# Patient Record
Sex: Female | Born: 1955 | Race: White | Hispanic: No | Marital: Married | Smoking: Never smoker
Health system: Southern US, Community
[De-identification: ages and names within clinical notes are randomized; demographics above are authoritative.]

## PROBLEM LIST (undated history)

## (undated) DIAGNOSIS — N809 Endometriosis, unspecified: Secondary | ICD-10-CM

## (undated) DIAGNOSIS — E119 Type 2 diabetes mellitus without complications: Secondary | ICD-10-CM

## (undated) DIAGNOSIS — E785 Hyperlipidemia, unspecified: Secondary | ICD-10-CM

## (undated) DIAGNOSIS — L900 Lichen sclerosus et atrophicus: Secondary | ICD-10-CM

## (undated) DIAGNOSIS — E039 Hypothyroidism, unspecified: Secondary | ICD-10-CM

## (undated) HISTORY — DX: Lichen sclerosus et atrophicus: L90.0

## (undated) HISTORY — DX: Hypothyroidism, unspecified: E03.9

## (undated) HISTORY — DX: Type 2 diabetes mellitus without complications: E11.9

## (undated) HISTORY — DX: Endometriosis, unspecified: N80.9

## (undated) HISTORY — PX: LAPAROSCOPY: SHX197

## (undated) HISTORY — PX: SHOULDER SURGERY: SHX246

## (undated) HISTORY — PX: WISDOM TOOTH EXTRACTION: SHX21

## (undated) HISTORY — DX: Hyperlipidemia, unspecified: E78.5

---

## 2011-05-21 DIAGNOSIS — E039 Hypothyroidism, unspecified: Secondary | ICD-10-CM | POA: Insufficient documentation

## 2013-12-31 ENCOUNTER — Encounter: Payer: Self-pay | Admitting: Obstetrics & Gynecology

## 2014-01-14 ENCOUNTER — Encounter: Payer: Self-pay | Admitting: Obstetrics & Gynecology

## 2014-02-25 ENCOUNTER — Ambulatory Visit (INDEPENDENT_AMBULATORY_CARE_PROVIDER_SITE_OTHER): Payer: Managed Care, Other (non HMO) | Admitting: Obstetrics & Gynecology

## 2014-02-25 ENCOUNTER — Encounter: Payer: Self-pay | Admitting: Obstetrics & Gynecology

## 2014-02-25 ENCOUNTER — Other Ambulatory Visit: Payer: Self-pay | Admitting: *Deleted

## 2014-02-25 VITALS — BP 125/63 | HR 72 | Resp 16 | Ht 67.0 in | Wt 143.0 lb

## 2014-02-25 DIAGNOSIS — N951 Menopausal and female climacteric states: Secondary | ICD-10-CM

## 2014-02-25 DIAGNOSIS — Z1151 Encounter for screening for human papillomavirus (HPV): Secondary | ICD-10-CM

## 2014-02-25 DIAGNOSIS — Z01419 Encounter for gynecological examination (general) (routine) without abnormal findings: Secondary | ICD-10-CM

## 2014-02-25 DIAGNOSIS — Z124 Encounter for screening for malignant neoplasm of cervix: Secondary | ICD-10-CM

## 2014-02-25 MED ORDER — CONJ ESTROG-MEDROXYPROGEST ACE 0.625-2.5 MG PO TABS
1.0000 | ORAL_TABLET | Freq: Every day | ORAL | Status: DC
Start: 2014-02-25 — End: 2014-02-25

## 2014-02-25 MED ORDER — CONJ ESTROG-MEDROXYPROGEST ACE 0.625-2.5 MG PO TABS: 1.0000 | ORAL_TABLET | Freq: Every day | ORAL | Status: DC

## 2014-02-27 NOTE — Progress Notes (Signed)
  Subjective:    Tanya Schmidt is a 58 y.o. female who presents for an annual exam. The patient has no complaints today. The patient is sexually active. GYN screening history: last pap: was normal and last mammogram: was normal. (per patient)The patient wears seatbelts: yes. The patient participates in regular exercise: yes. Has the patient ever been transfused or tattooed?: not asked. The patient reports that there is not domestic violence in her life.   Pt is having severe hot flashes and night sweats that is inhibiting her sleep.  She used to be on prempro but stopped.  She is interested in resuming because hot flashes are unbearable.  We discussed risks of HRT and she accepts these risks (stroke, breast cnacer, DVT/PE).  She agrees to tyr for one year at lowest dose and then revisit weaning.  Pt also c/o pain on vulva/perineum--feels like stretching.  Menstrual History: OB History   Grav Para Term Preterm Abortions TAB SAB Ect Mult Living   No LMP recorded. Patient is postmenopausal.    The following portions of the patient's history were reviewed and updated as appropriate: allergies, current medications, past family history, past medical history, past social history, past surgical history and problem list.  Review of Systems Pertinent items are noted in HPI.    Objective:      Filed Vitals:   02/25/14 1544  BP: 125/63  Pulse: 72  Resp: 16  Height:  (1.702 m)  Weight: 143 lb (64.864 kg)   Vitals:  WNL General appearance: alert, cooperative and no distress Head: Normocephalic, without obvious abnormality, atraumatic Eyes: negative Throat: lips, mucosa, and tongue normal; teeth and gums normal Lungs: clear to auscultation bilaterally Breasts: normal appearance, no masses or tenderness, No nipple retraction or dimpling, No nipple discharge or bleeding Heart: regular rate and rhythm Abdomen: soft, non-tender; bowel sounds normal; no masses,  no  organomegaly  Pelvic:  External Genitalia:  Tanner V, white, thick skin on perineum (??lichen sclerosis) Urethra:  No prolapse Vagina:  Pale pink, normal rugae, no blood or discharge Cervix:  No CMT, no lesion Uterus:  Normal size and contour, non tender Adnexa:  Normal, no masses, non tender Rectovaginal Septum:  Non tender, no masses  Extremities: no edema, redness or tenderness in the calves or thighs Skin: no lesions or rash Lymph nodes: Axillary adenopathy: none    .    Assessment:    Healthy female exam.  Probable lichen sclerosis--need biopsy   Plan:     All questions answered. Mammogram. Pap smear.  Pt to return for biopsy Prempro ordered.

## 2014-03-02 LAB — CYTOLOGY - PAP

## 2014-03-15 ENCOUNTER — Encounter: Payer: Self-pay | Admitting: Obstetrics & Gynecology

## 2014-03-15 ENCOUNTER — Ambulatory Visit (INDEPENDENT_AMBULATORY_CARE_PROVIDER_SITE_OTHER): Payer: Managed Care, Other (non HMO) | Admitting: Obstetrics & Gynecology

## 2014-03-15 VITALS — BP 116/50 | HR 67 | Resp 16 | Ht 67.0 in | Wt 142.0 lb

## 2014-03-15 DIAGNOSIS — N9089 Other specified noninflammatory disorders of vulva and perineum: Secondary | ICD-10-CM

## 2014-03-15 NOTE — Addendum Note (Signed)
Addended by: Granville Lewis on: 03/15/2014 05:48 PM   Modules accepted: Orders

## 2014-03-15 NOTE — Progress Notes (Signed)
VULVAR BIOPSY NOTE  The indications for vulvar biopsy (rule out neoplasia, establish lichen sclerosus diagnosis) were reviewed.   Risks of the biopsy including pain, bleeding, infection, inadequate specimen, scarring and need for additional procedures  were discussed. The patient stated understanding and agreed to undergo procedure today. Consent was signed,  time out performed.  The patient's vulva was prepped with Betadine. 1% lidocaine was injected into perineum. A 3-mm punch biopsy was done  One ust below fourchette and one just above anus, biopsy tissue was picked up with sterile forceps and sterile scissors were used to excise the lesion.  Small bleeding was noted and hemostasis was achieved using silver nitrate sticks.  The patient tolerated the procedure well. Post-procedure instructions  (pelvic rest for one week) were given to the patient. The patient is to call with heavy bleeding, fever greater than 100.4, foul smelling vaginal discharge or other concerns. The patient will be return to clinic in two weeks for discussion of results.

## 2014-03-19 ENCOUNTER — Telehealth: Payer: Self-pay | Admitting: *Deleted

## 2014-03-19 ENCOUNTER — Encounter: Payer: Self-pay | Admitting: Obstetrics & Gynecology

## 2014-03-19 DIAGNOSIS — L900 Lichen sclerosus et atrophicus: Secondary | ICD-10-CM

## 2014-03-19 DIAGNOSIS — N904 Leukoplakia of vulva: Secondary | ICD-10-CM | POA: Insufficient documentation

## 2014-03-19 MED ORDER — CLOBETASOL PROPIONATE 0.05 % EX OINT: 1.0000 "application " | TOPICAL_OINTMENT | Freq: Two times a day (BID) | CUTANEOUS | Status: DC

## 2014-03-19 NOTE — Telephone Encounter (Signed)
Gave pt path results and sent Temovate to pharm

## 2014-03-23 ENCOUNTER — Ambulatory Visit (HOSPITAL_COMMUNITY): Admission: RE | Admit: 2014-03-23 | Payer: Managed Care, Other (non HMO) | Source: Ambulatory Visit

## 2014-04-26 ENCOUNTER — Encounter: Payer: Self-pay | Admitting: Obstetrics & Gynecology

## 2014-05-03 ENCOUNTER — Ambulatory Visit (INDEPENDENT_AMBULATORY_CARE_PROVIDER_SITE_OTHER): Payer: Managed Care, Other (non HMO) | Admitting: Obstetrics & Gynecology

## 2014-05-03 ENCOUNTER — Encounter: Payer: Self-pay | Admitting: Obstetrics & Gynecology

## 2014-05-03 VITALS — BP 114/62 | HR 70 | Resp 16 | Ht 67.0 in | Wt 138.0 lb

## 2014-05-03 DIAGNOSIS — L900 Lichen sclerosus et atrophicus: Secondary | ICD-10-CM | POA: Insufficient documentation

## 2014-05-03 NOTE — Progress Notes (Addendum)
   Subjective:    Patient ID: Tanya Schmidt, female    DOB: [birth year], 58 y.o.   MRN: [id number]  HPI  Patient is a 58 year old female with lichen sclerosis. Patient has been on temovate twice a day for a week and once a day for 3 weeks. She has improved symptoms.  Patient has less itching and less tightness.  Review of Systems  Constitutional: Negative.   Gastrointestinal: Negative.   Genitourinary: Negative for vaginal bleeding and vaginal discharge.       Objective:   Physical Exam  Genitourinary:             Assessment & Plan:  58 year old female with lichen sclerosus onTemovate continue once nightly clobetasol. At the end of 2 weeks patient is to feel for the thickened plaque. She was shown and can tell the plaque from her exam today. The patient still fills the plaque she will continue once a day for another 2 weeks and return to the office. It is not completely resolved we may consider a injection directly into the lesion.

## 2014-05-31 ENCOUNTER — Encounter: Payer: Self-pay | Admitting: Obstetrics & Gynecology

## 2014-05-31 ENCOUNTER — Ambulatory Visit (INDEPENDENT_AMBULATORY_CARE_PROVIDER_SITE_OTHER): Payer: Managed Care, Other (non HMO) | Admitting: Obstetrics & Gynecology

## 2014-05-31 VITALS — BP 116/63 | HR 70 | Resp 16 | Ht 66.75 in | Wt 138.0 lb

## 2014-05-31 DIAGNOSIS — L900 Lichen sclerosus et atrophicus: Secondary | ICD-10-CM

## 2014-05-31 NOTE — Progress Notes (Signed)
   Subjective:    Patient ID: Tanya Schmidt, female    DOB: 01/31/1956, 58 y.o.   MRN: [id number]  HPI Pt presents for evaluation of lichen sclerosis.  Pt putting on Temovate 3-4 times a week.  Symptoms are much improved.  BMs no longer hurt.     Review of Systems    as above Objective:   Physical Exam  Genitourinary:             Assessment & Plan:  Lichen sclerosis Temovate 3x a week RTC 3 months.

## 2014-10-22 ENCOUNTER — Telehealth: Payer: Self-pay | Admitting: *Deleted

## 2014-10-22 DIAGNOSIS — B3731 Acute candidiasis of vulva and vagina: Secondary | ICD-10-CM

## 2014-10-22 DIAGNOSIS — B373 Candidiasis of vulva and vagina: Secondary | ICD-10-CM

## 2014-10-22 MED ORDER — FLUCONAZOLE 150 MG PO TABS: 150.0000 mg | ORAL_TABLET | Freq: Once | ORAL | Status: DC

## 2014-10-22 NOTE — Telephone Encounter (Signed)
Pt called in stating she has had elevated blood sugar readings and vaginal area is irritated. Pt states she is sure it is a yeast infection. Pt asked if we could send in Diflucan today and if no better she will call on Monday to schedule appt for eval. Sent meds to [facility] per pt req.

## 2015-03-03 ENCOUNTER — Ambulatory Visit: Payer: Managed Care, Other (non HMO) | Admitting: Obstetrics & Gynecology

## 2015-03-07 ENCOUNTER — Encounter: Payer: Self-pay | Admitting: Obstetrics & Gynecology

## 2015-03-07 ENCOUNTER — Ambulatory Visit (INDEPENDENT_AMBULATORY_CARE_PROVIDER_SITE_OTHER): Payer: Managed Care, Other (non HMO) | Admitting: Obstetrics & Gynecology

## 2015-03-07 VITALS — BP 123/69 | HR 78 | Resp 16 | Ht 67.0 in | Wt 145.0 lb

## 2015-03-07 DIAGNOSIS — L900 Lichen sclerosus et atrophicus: Secondary | ICD-10-CM | POA: Diagnosis not present

## 2015-03-12 NOTE — Progress Notes (Signed)
  Subjective:    Tanya Schmidt is a 59 y.o. female who presents for an annual exam. The patient has complaints of pain at introitus and rectum from lichen sclerosis. The patient is sexually active. GYN screening history: last pap: was normal and last mammogram: was normal. The patient wears seatbelts: yes. The patient participates in regular exercise: yes.   Menstrual History: OB History    Gravida Para Term Preterm AB TAB SAB Ectopic Multiple Living   No LMP recorded. Patient is postmenopausal.    The following portions of the patient's history were reviewed and updated as appropriate: allergies, current medications, past family history, past medical history, past social history, past surgical history and problem list.  Review of Systems Pertinent items are noted in HPI.   Patient denies CP, SOB, HA, PMB, depression, abdominal pain   Objective:      Filed Vitals:   03/07/15 1523  BP: 123/69  Pulse: 78  Resp: 16  Height:  (1.702 m)  Weight: 145 lb (65.772 kg)   Vitals:  WNL General appearance: alert, cooperative and no distress  HEENT: Normocephalic, without obvious abnormality, atraumatic Eyes: negative Throat: lips, mucosa, and tongue normal; teeth and gums normal  Respiratory: Clear to auscultation bilaterally  CV: Regular rate and rhythm  Breasts:  Normal appearance, no masses or tenderness, no nipple retraction or dimpling  GI: Soft, non-tender; bowel sounds normal; no masses,  no organomegaly  GU: External Genitalia:  Tanner V, lichen sclerosis is present and slightly worse from last exam.  Fissure in fourchette of introitus. Urethra:  No prolapse   Vagina: Pink, normal rugae, no blood or discharge  Cervix: No CMT, no lesion  Uterus:  Normal size and contour, non tender  Adnexa: Normal, no masses, non tender  Musculoskeletal: No edema, redness or tenderness in the calves or thighs  Skin: No lesions or rash  Lymphatic: Axillary  adenopathy: none    Psychiatric: Normal mood and behavior    Assessment:    Healthy female exam.   Lichen Scleoris   Plan:    Pap up to date Mammogram due 11/16 Resume clobetasol daily for a week then twice a week until seen again. Re-biopsy is plaque present and consider intra lesion injection of clobetasol.

## 2015-04-21 ENCOUNTER — Other Ambulatory Visit: Payer: Self-pay | Admitting: Obstetrics & Gynecology

## 2015-05-02 ENCOUNTER — Telehealth: Payer: Self-pay | Admitting: *Deleted

## 2015-05-02 ENCOUNTER — Encounter: Payer: Self-pay | Admitting: *Deleted

## 2015-05-02 NOTE — Telephone Encounter (Signed)
-----   Message from Lesly DukesKelly H Leggett, MD sent at 04/26/2015  6:13 AM EDT ----- I would like to see her in January to see if the placque has lessened. ----- Message -----    From: Granville LewisLora L Maymunah Stegemann, RN    Sent: 04/25/2015   8:00 AM      To: Lesly DukesKelly H Leggett, MD  She doesn't have one scheduled.  Last u saw her was for annual 03/08/15 ----- Message -----    From: Lesly DukesKelly H Leggett, MD    Sent: 04/24/2015   2:49 PM      To: Ever[facility]  When is Alexia Freestonenna Marie coming again?  She needs f/u in a few months after last visit.  I just refilled her temobate

## 2015-05-02 NOTE — Telephone Encounter (Signed)
Letter mailed to pt regarding making a R/U appt in January with Dr Penne LashLeggett to recheck Lichen Sclerosus

## 2015-05-12 ENCOUNTER — Telehealth: Payer: Self-pay | Admitting: *Deleted

## 2015-05-12 DIAGNOSIS — N951 Menopausal and female climacteric states: Secondary | ICD-10-CM

## 2015-05-12 MED ORDER — CONJ ESTROG-MEDROXYPROGEST ACE 0.625-2.5 MG PO TABS: 1.0000 | ORAL_TABLET | Freq: Every day | ORAL | Status: DC

## 2015-05-12 NOTE — Telephone Encounter (Signed)
rcvd fax from pharmacy to refill Prempro

## 2015-05-13 MED ORDER — CONJ ESTROG-MEDROXYPROGEST ACE 0.625-2.5 MG PO TABS: 1.0000 | ORAL_TABLET | Freq: Every day | ORAL | Status: DC

## 2015-05-13 NOTE — Addendum Note (Signed)
Addended by: Arne ClevelandHUTCHINSON, MANDY J on: 05/13/2015 10:41 AM   Modules accepted: Orders

## 2015-05-13 NOTE — Telephone Encounter (Signed)
Changed pharmacy and resent Prempro

## 2016-03-15 ENCOUNTER — Ambulatory Visit: Payer: Managed Care, Other (non HMO) | Admitting: Obstetrics & Gynecology

## 2016-04-02 ENCOUNTER — Encounter: Payer: Self-pay | Admitting: Obstetrics & Gynecology

## 2016-04-02 ENCOUNTER — Ambulatory Visit (INDEPENDENT_AMBULATORY_CARE_PROVIDER_SITE_OTHER): Payer: Managed Care, Other (non HMO) | Admitting: Obstetrics & Gynecology

## 2016-04-02 VITALS — BP 126/72 | HR 67 | Ht 67.0 in | Wt 148.0 lb

## 2016-04-02 DIAGNOSIS — L900 Lichen sclerosus et atrophicus: Secondary | ICD-10-CM

## 2016-04-02 DIAGNOSIS — Z0000 Encounter for general adult medical examination without abnormal findings: Secondary | ICD-10-CM

## 2016-04-02 DIAGNOSIS — Z01419 Encounter for gynecological examination (general) (routine) without abnormal findings: Secondary | ICD-10-CM | POA: Diagnosis not present

## 2016-04-02 MED ORDER — CLOBETASOL PROPIONATE 0.05 % EX OINT: TOPICAL_OINTMENT | CUTANEOUS | 1 refills | Status: DC

## 2016-04-02 MED ORDER — CONJ ESTROG-MEDROXYPROGEST ACE 0.3-1.5 MG PO TABS: 1.0000 | ORAL_TABLET | Freq: Every day | ORAL | 6 refills | Status: DC

## 2016-04-04 NOTE — Progress Notes (Signed)
Subjective:    Tanya Schmidt is a 60 y.o. female who presents for an annual exam. The patient has no complaints today.  Vulva does not itch, but she is not using steroid cream as prescribed. The patient is sexually active with some dyspareunia. GYN screening history: last pap: was normal in 2015. The patient wears seatbelts: yes. The patient participates in regular exercise: no. Has the patient ever been transfused or tattooed?: not asked. The patient reports that there is not domestic violence in her life.   Menstrual History: OB History    Gravida Para Term Preterm AB Living   3 2 2     2    SAB TAB Ectopic Multiple Live Births                  No LMP recorded. Patient is postmenopausal.    The following portions of the patient's history were reviewed and updated as appropriate: allergies, current medications, past family history, past medical history, past social history, past surgical history and problem list.  Review of Systems Pertinent items noted in HPI and remainder of comprehensive ROS otherwise negative.    Objective:      Vitals:   04/02/16 1514  BP: 126/72  Pulse: 67  Weight: 148 lb (67.1 kg)  Height: 5\' 7"  (1.702 m)   Vitals:  WNL General appearance: alert, cooperative and no distress  HEENT: Normocephalic, without obvious abnormality, atraumatic Eyes: negative Throat: lips, mucosa, and tongue normal; teeth and gums normal  Respiratory: Clear to auscultation bilaterally  CV: Regular rate and rhythm  Breasts:  Normal appearance, no masses or tenderness, no nipple retraction or dimpling  GI: Soft, non-tender; bowel sounds normal; no masses,  no organomegaly  GU: External Genitalia:  Tanner V, no lesion Urethra:  No prolapse  Introitus--there is new change in architecture with tight band at 6 o'clock.  Plaque on perineum improved.  Vagina: Pink, normal rugae, no blood or discharge  Cervix: No CMT, no lesion  Uterus:  Normal size and contour, non tender   Adnexa: Normal, no masses, non tender  Musculoskeletal: No edema, redness or tenderness in the calves or thighs  Skin: No lesions or rash  Lymphatic: Axillary adenopathy: none    Psychiatric: Normal mood and behavior    .    Assessment:    Healthy female exam.   Lichen sclerosis   Plan:     Mammogram yearly Colonoscopy q 10 years Resume clobetasol--bid for a week, daily for a week, 3x a week for a week, then twice a week. Pt wants to continue HRT (aware of increased risk of breast cance with prolonged use). RTC 6 weeks.

## 2016-04-10 ENCOUNTER — Telehealth: Payer: Self-pay | Admitting: *Deleted

## 2016-04-10 DIAGNOSIS — B3731 Acute candidiasis of vulva and vagina: Secondary | ICD-10-CM

## 2016-04-10 DIAGNOSIS — B373 Candidiasis of vulva and vagina: Secondary | ICD-10-CM

## 2016-04-10 MED ORDER — CONJ ESTROG-MEDROXYPROGEST ACE 0.3-1.5 MG PO TABS: 1.0000 | ORAL_TABLET | Freq: Every day | ORAL | 6 refills | Status: DC

## 2016-04-10 MED ORDER — FLUCONAZOLE 150 MG PO TABS: 150.0000 mg | ORAL_TABLET | Freq: Every day | ORAL | 0 refills | Status: DC

## 2016-04-10 MED ORDER — CLOBETASOL PROPIONATE 0.05 % EX OINT: TOPICAL_OINTMENT | CUTANEOUS | 1 refills | Status: DC

## 2016-04-10 NOTE — Telephone Encounter (Signed)
Pt called stating that she is diabetic and does have a yeast infection and requested a RX for Diflucan.  This [facility].  She also requested that her Clob[facility]asol and [facility] since it was long term meds.  [facility] sent to [facility]

## 2016-05-22 ENCOUNTER — Encounter: Payer: Self-pay | Admitting: Obstetrics & Gynecology

## 2016-05-22 ENCOUNTER — Ambulatory Visit (INDEPENDENT_AMBULATORY_CARE_PROVIDER_SITE_OTHER): Payer: Managed Care, Other (non HMO) | Admitting: Obstetrics & Gynecology

## 2016-05-22 VITALS — BP 114/63 | HR 98 | Resp 16 | Ht 67.0 in | Wt 150.0 lb

## 2016-05-22 DIAGNOSIS — N904 Leukoplakia of vulva: Secondary | ICD-10-CM | POA: Diagnosis not present

## 2016-05-24 NOTE — Progress Notes (Signed)
   Subjective:    Patient ID: Tanya Schmidt, female    DOB: 1955-08-05, 60 y.o.   MRN: [id number]  HPI Pt presents with pressure and itching at anus.  She is unsure whether she has a hemorrhoid.  Pt has been using clobetasol 2-3 times a week since last visit  Where the lichen had noted to have worsened.  (picture)   Review of Systems  Constitutional: Negative.   Respiratory: Negative.   Cardiovascular: Negative.   Gastrointestinal: Positive for constipation.  Genitourinary: Positive for dyspareunia and vaginal pain. Negative for menstrual problem.  Psychiatric/Behavioral: Negative.    Vitals:   05/22/16 1458  BP: 114/63  Pulse: 98  Resp: 16  Weight: 150 lb (68 kg)  Height: 5\' 7"  (1.702 m)      Objective:   Physical Exam  Constitutional: She is oriented to person, place, and time. She appears well-developed and well-nourished. No distress.  Cardiovascular: Normal rate.   Pulmonary/Chest: Effort normal.  Abdominal: Soft. There is no tenderness.  Genitourinary:  Genitourinary Comments: Thick band exists at 6 o'clock with increase in LS on perineum towards anus.  ? Small hemorrhoid at 12 0'clock of anus.    Musculoskeletal: She exhibits no edema.  Neurological: She is alert and oriented to person, place, and time.  Skin: Skin is warm and dry.  Psychiatric: She has a normal mood and affect.   Vitals:   05/22/16 1458  BP: 114/63  Pulse: 98  Resp: 16  Weight: 150 lb (68 kg)  Height: 5\' 7"  (1.702 m)    Assessment & Plan:  60 yo female with slightly worsened LS  1-From up to date: Thickened hypertrophic plaques may respond poorly to topical corticosteroids. In these cases, we suggest injection of triamcinolone hexacetonide or triamcinolone acetonide directly into the area of involvement once per month for three months [87]. Pretreatment with a small amount of a topical anesthetic (eg, eutectic mixture of lidocaine and prilocaine) and use of a small gauge needle help to  minimize patient discomfort. For small lesions (no more than 2 x 2 cm), we add 2 mL saline to 1 mL of triamcinolone (10 mg/mL) to make a solution of 3.3 mg/mL. We then inject 0.5 to 1 mL intralesionally with a 25 to 30 gauge needle to treat a lesion of 1 to 2 cm. For lesions covering a larger area, we perform several injections using the same concentration. We do not inject more than 3 mL per treatment session.   Will order triamcinolone and inject monthly for 3 months  [place] and [place] to [place].

## 2016-06-06 ENCOUNTER — Ambulatory Visit (INDEPENDENT_AMBULATORY_CARE_PROVIDER_SITE_OTHER): Payer: Managed Care, Other (non HMO) | Admitting: Obstetrics & Gynecology

## 2016-06-06 ENCOUNTER — Encounter: Payer: Self-pay | Admitting: Obstetrics & Gynecology

## 2016-06-06 VITALS — BP 100/63 | HR 76 | Ht 67.0 in | Wt 151.0 lb

## 2016-06-06 DIAGNOSIS — L900 Lichen sclerosus et atrophicus: Secondary | ICD-10-CM | POA: Diagnosis not present

## 2016-06-06 NOTE — Progress Notes (Signed)
   Subjective:    Patient ID: Tanya Schmidt, female    DOB: December 29, 1955, 60 y.o.   MRN: [id number]  HPI  Patient presents for continuation of lichen sclerosus complaints. Patient has been compliant with topical clobetasol. Her still itching and pain on the perineum.  Review of Systems  Genitourinary: Positive for vaginal pain. Negative for vaginal bleeding.       Objective:   Physical Exam  Constitutional: She appears well-developed and well-nourished. No distress.  Abdominal: Soft.  Genitourinary:  Genitourinary Comments: Perineum slightly improved from 3 weeks ago with the clobetasol use. Plaque still present on perineum  After informed consent was obtained patient's patient dorsolithotomy position the area was cleaned with Betadine. A mixture of triamcinolone and saline was injected into the perineum. Patient tolerated procedure well.  (See prior note about indications and directions for mixing).    Assessment & Plan:  60 year old female with lichen sclerosus plaque not responsive to topical therapy  1-triamcenalone injection as above 2-A she has type 1 diabetes on an S1 pump. Patient should monitor her glucose closely and increase basal rate as indicated. 3-RTC 1 month for injection

## 2016-06-06 NOTE — Progress Notes (Signed)
Pt presents for 

## 2016-06-08 ENCOUNTER — Telehealth: Payer: Self-pay | Admitting: *Deleted

## 2016-06-08 DIAGNOSIS — B3731 Acute candidiasis of vulva and vagina: Secondary | ICD-10-CM

## 2016-06-08 DIAGNOSIS — B373 Candidiasis of vulva and vagina: Secondary | ICD-10-CM

## 2016-06-08 MED ORDER — FLUCONAZOLE 150 MG PO TABS: ORAL_TABLET | ORAL | 1 refills | Status: DC

## 2016-06-08 NOTE — Telephone Encounter (Signed)
Pt called stating that she is having itching, burning from vagina to rectum.  She is requesting Diflucan for itching since she is pretty sure this is yeast.  She is diabetic and most likely is yeast.  [facility].  Pt aware that if not better by the first of the week she will need to be seen.

## 2016-07-12 ENCOUNTER — Ambulatory Visit: Payer: Managed Care, Other (non HMO) | Admitting: Obstetrics & Gynecology

## 2016-07-26 ENCOUNTER — Encounter: Payer: Self-pay | Admitting: Obstetrics & Gynecology

## 2016-07-26 ENCOUNTER — Ambulatory Visit (INDEPENDENT_AMBULATORY_CARE_PROVIDER_SITE_OTHER): Payer: Managed Care, Other (non HMO) | Admitting: Obstetrics & Gynecology

## 2016-07-26 VITALS — BP 122/65 | HR 76 | Ht 67.0 in | Wt 154.0 lb

## 2016-07-26 DIAGNOSIS — N9089 Other specified noninflammatory disorders of vulva and perineum: Secondary | ICD-10-CM

## 2016-07-26 DIAGNOSIS — L900 Lichen sclerosus et atrophicus: Secondary | ICD-10-CM

## 2016-07-26 MED ORDER — CLOTRIMAZOLE-BETAMETHASONE 1-0.05 % EX CREA: TOPICAL_CREAM | CUTANEOUS | 0 refills | Status: DC

## 2016-07-26 NOTE — Progress Notes (Signed)
   Subjective:    Patient ID: Tanya Schmidt, female    DOB: 10-15-1955, 61 y.o.   MRN: [id number]  HPI  61 yo female presents for 2nd injection of steroid to perineum.  Pt's plaque is decreased.  Pt complaining of redness that feels "yeasty" on her labia majora.  Pt's insulin requirements increased 30 percent for one week after last steroid injection  Review of Systems  Constitutional: Negative.   Gastrointestinal: Negative.   Genitourinary: Positive for vaginal pain. Negative for vaginal bleeding and vaginal discharge.       Objective:   Physical Exam  Constitutional: She appears well-developed and well-nourished. No distress.  HENT:  Head: Normocephalic and atraumatic.  Abdominal: Soft. She exhibits no distension.  Genitourinary:  Genitourinary Comments: Redness on labia majora looks like contact allergy or yeast See picture under media of perinuem. Plaque extending from introitus is smaller and less firm.  Hemorrhoid present.    Vitals:   07/26/16 1600  BP: 122/65  Pulse: 76  Weight: 154 lb (69.9 kg)  Height: 5\' 7"  (1.702 m)    Assessment & Plan:  61 yo female with lichen sclerosis and contact dermatitis vs yeast of labia majora  1.  LS--2nd steroid injection completed; continue clobetasol twice a week 2.  lotrisome to labia majora; swith to Cetaphil, recommend stopping the flushable wipes that are used at time of urinating and bowel movements. 3.  RTC 1 month for third injection.

## 2016-08-27 ENCOUNTER — Ambulatory Visit (INDEPENDENT_AMBULATORY_CARE_PROVIDER_SITE_OTHER): Payer: Managed Care, Other (non HMO) | Admitting: Obstetrics & Gynecology

## 2016-08-27 ENCOUNTER — Encounter: Payer: Self-pay | Admitting: Obstetrics & Gynecology

## 2016-08-27 VITALS — BP 143/65 | HR 65 | Ht 67.0 in | Wt 149.0 lb

## 2016-08-27 DIAGNOSIS — L900 Lichen sclerosus et atrophicus: Secondary | ICD-10-CM

## 2016-08-27 DIAGNOSIS — R197 Diarrhea, unspecified: Secondary | ICD-10-CM

## 2016-08-27 DIAGNOSIS — K648 Other hemorrhoids: Secondary | ICD-10-CM

## 2016-08-27 NOTE — Progress Notes (Signed)
   Subjective:    Patient ID: Tanya Schmidt, female    DOB: 1956/02/02, 61 y.o.   MRN: [id number]  HPI  Pt presents for evaluation of Her lichen sclerosus. Patient has been using clobetasol cream twice a week and doing well. She has no pain with intercourse. Patient has been having frequent bowel movements and burning near her anus. This is not new for her but did get worse. Patient knows that she is not getting clean and she wipes. She does have a history of hemorrhoids. She has a negative colonoscopy in the past but can't quite irregular and when that was. Patient states her bowel movements are still slightly flattened.  She thought this was due to her hemorrhoids. Patient denies vaginal discharge vaginal bleeding or dyspareunia.  Review of Systems  Constitutional: Negative.   Cardiovascular: Negative.   Gastrointestinal: Positive for rectal pain. Negative for anal bleeding, constipation, diarrhea and vomiting.  Genitourinary: Negative for pelvic pain, vaginal bleeding, vaginal discharge and vaginal pain.       Objective:   Physical Exam  Constitutional: She is oriented to person, place, and time. She appears well-developed and well-nourished. No distress.  HENT:  Head: Normocephalic and atraumatic.  Eyes: Conjunctivae are normal.  Pulmonary/Chest: Effort normal.  Abdominal: Soft. Bowel sounds are normal. She exhibits no distension and no mass. There is no tenderness. There is no rebound and no guarding.  Genitourinary:     Musculoskeletal: She exhibits no edema.  Neurological: She is alert and oriented to person, place, and time.  Skin: Skin is warm and dry.  Psychiatric: She has a normal mood and affect.  Vitals reviewed.  Vitals:   08/27/16 1418  BP: (!) 143/65  Pulse: 65  Weight: 149 lb (67.6 kg)  Height: 5\' 7"  (1.702 m)    Assessment & Plan:  61 year old female with lichen sclerosus and anal irritation due to frequent soft bowel movements and incomplete  wiping  1.  No need for steroid injection today as the plaque on her perineum is now resolved. Patient should continue clobetasol twice a week that area. There is no evidence of atrophic vaginitis. 2-referral to GI for rectal and GI issues as described above.

## 2016-09-20 LAB — HM COLONOSCOPY

## 2016-12-10 ENCOUNTER — Other Ambulatory Visit: Payer: Self-pay | Admitting: Obstetrics & Gynecology

## 2016-12-10 DIAGNOSIS — B373 Candidiasis of vulva and vagina: Secondary | ICD-10-CM

## 2016-12-10 DIAGNOSIS — B3731 Acute candidiasis of vulva and vagina: Secondary | ICD-10-CM

## 2017-02-26 ENCOUNTER — Telehealth: Payer: Self-pay | Admitting: *Deleted

## 2017-02-26 DIAGNOSIS — B373 Candidiasis of vulva and vagina: Secondary | ICD-10-CM

## 2017-02-26 DIAGNOSIS — B3731 Acute candidiasis of vulva and vagina: Secondary | ICD-10-CM

## 2017-02-26 MED ORDER — FLUCONAZOLE 150 MG PO TABS: ORAL_TABLET | ORAL | 1 refills | Status: DC

## 2017-02-26 NOTE — Telephone Encounter (Signed)
-----   Message from Pennie BanterMarni W Smith sent at 02/26/2017  2:35 PM EDT ----- Regarding: Rx request Patient has a yeast infection and is requesting Diflucan be sent to her pharmacy [facility]( [place] [facility])

## 2017-02-26 NOTE — Telephone Encounter (Signed)
Pt called requesting a RF on her Diflucan 150 mg.  She is diabetic and per Dr Marice Potterove, she may have a RF.  [facility]

## 2017-05-21 ENCOUNTER — Other Ambulatory Visit (HOSPITAL_COMMUNITY): Payer: Self-pay | Admitting: Obstetrics & Gynecology

## 2017-05-21 DIAGNOSIS — B373 Candidiasis of vulva and vagina: Secondary | ICD-10-CM

## 2017-05-21 DIAGNOSIS — B3731 Acute candidiasis of vulva and vagina: Secondary | ICD-10-CM

## 2017-09-10 ENCOUNTER — Telehealth: Payer: Self-pay | Admitting: Obstetrics & Gynecology

## 2017-09-10 ENCOUNTER — Other Ambulatory Visit: Payer: Self-pay | Admitting: *Deleted

## 2017-09-10 DIAGNOSIS — Z1231 Encounter for screening mammogram for malignant neoplasm of breast: Secondary | ICD-10-CM

## 2017-09-10 MED ORDER — CONJ ESTROG-MEDROXYPROGEST ACE 0.3-1.5 MG PO TABS: 1.0000 | ORAL_TABLET | Freq: Every day | ORAL | 0 refills | Status: DC

## 2017-09-10 NOTE — Telephone Encounter (Signed)
Pt called back states had [facility] within the last 3-4 months. Advised pt to obtain report and bring to appt, it is not available in care everywhere. Pt verbalized understanding.

## 2017-09-10 NOTE — Telephone Encounter (Signed)
Pt called scheduled annual GYN visit 10/09/17. Pt needs PREMPRO refill. Pt states if filling short supply until appt please [facility] script [facility]. If filling for one year please [facility] to [facility]. Call pt to confirm [phone].

## 2017-09-10 NOTE — Telephone Encounter (Signed)
Pt called to request a RF on Prempro.  She has appt for annual with Dr Penne LashLeggett on 10/09/17.  She is overdue for her mammogram and that order was also placed.  1 RF given for Prempro until her appt.

## 2017-10-09 ENCOUNTER — Ambulatory Visit (INDEPENDENT_AMBULATORY_CARE_PROVIDER_SITE_OTHER): Payer: Managed Care, Other (non HMO) | Admitting: Obstetrics & Gynecology

## 2017-10-09 ENCOUNTER — Encounter: Payer: Self-pay | Admitting: Obstetrics & Gynecology

## 2017-10-09 VITALS — BP 147/63 | HR 86 | Resp 16 | Ht 67.0 in | Wt 145.0 lb

## 2017-10-09 DIAGNOSIS — Z01419 Encounter for gynecological examination (general) (routine) without abnormal findings: Secondary | ICD-10-CM | POA: Diagnosis not present

## 2017-10-09 DIAGNOSIS — Z124 Encounter for screening for malignant neoplasm of cervix: Secondary | ICD-10-CM | POA: Diagnosis not present

## 2017-10-09 DIAGNOSIS — Z1151 Encounter for screening for human papillomavirus (HPV): Secondary | ICD-10-CM | POA: Diagnosis not present

## 2017-10-09 NOTE — Progress Notes (Signed)
Subjective:     Tanya Schmidt is a 62 y.o. female here for a routine exam.  Current complaints: fissures of perineum.  ?due to diarrhea.  LS stable most of the time.  Uses clobetasol twice a week.  Had colonoscpy and nml.  Cleared by GI.   Gynecologic History No LMP recorded. Patient is postmenopausal. Contraception: post menopausal status Last Pap: 2015. Results were: normal Last mammogram: 2019. Results were: normal  Obstetric History OB History  Gravida Para Term Preterm AB Living  3 2 2     2   SAB TAB Ectopic Multiple Live Births               # Outcome Date GA Lbr Len/2nd Weight Sex Delivery Anes PTL Lv  3 Gravida           2 Term    11 lb 3 oz (5.075 kg)  CS-Unspec     1 Term    8 lb 3 oz (3.714 kg)  CS-Unspec        The following portions of the patient's history were reviewed and updated as appropriate: allergies, current medications, past family history, past medical history, past social history, past surgical history and problem list.  Review of Systems Pertinent items noted in HPI and remainder of comprehensive ROS otherwise negative.    Objective:      Vitals:   10/09/17 0936  BP: (!) 147/63  Pulse: 86  Resp: 16  Weight: 145 lb (65.8 kg)  Height: 5\' 7"  (1.702 m)   Vitals:  WNL General appearance: alert, cooperative and no distress  HEENT: Normocephalic, without obvious abnormality, atraumatic Eyes: negative Throat: lips, mucosa, and tongue normal; teeth and gums normal  Respiratory: Clear to auscultation bilaterally  CV: Regular rate and rhythm  Breasts:  Normal appearance, no masses or tenderness, no nipple retraction or dimpling  GI: Soft, non-tender; bowel sounds normal; no masses,  no organomegaly  GU: External Genitalia:  Tanner V, no lesion Urethra:  No prolapse   Vagina: Pink, normal rugae, no blood or discharge; LS--no plaque, small fissures.)  Cervix: No CMT, no lesion  Uterus:  Normal size and contour, non tender  Adnexa: Normal, no  masses, non tender  Musculoskeletal: No edema, redness or tenderness in the calves or thighs  Skin: No lesions or rash  Lymphatic: Axillary adenopathy: none     Psychiatric: Normal mood and behavior    Assessment:    Healthy female exam.   Stable LS   Plan:      Pap with cotesting Cont steroids Yearly mammograms PCP for other health main Stop Prempro and see how she does symptomatically.

## 2017-10-11 LAB — CYTOLOGY - PAP
DIAGNOSIS: NEGATIVE
HPV: NOT DETECTED

## 2018-02-10 ENCOUNTER — Telehealth: Payer: Self-pay | Admitting: *Deleted

## 2018-02-10 MED ORDER — CLOBETASOL PROPIONATE 0.05 % EX OINT: TOPICAL_OINTMENT | CUTANEOUS | 1 refills | Status: DC

## 2018-02-10 NOTE — Telephone Encounter (Signed)
Temovate cream changed to Biweekly as a maintenance dose.

## 2018-02-10 NOTE — Telephone Encounter (Signed)
RF sent to [facility] [employer].

## 2018-07-04 ENCOUNTER — Telehealth: Payer: Self-pay

## 2018-07-04 DIAGNOSIS — B379 Candidiasis, unspecified: Secondary | ICD-10-CM

## 2018-07-04 MED ORDER — FLUCONAZOLE 150 MG PO TABS: ORAL_TABLET | ORAL | 0 refills | Status: DC

## 2018-07-04 NOTE — Telephone Encounter (Signed)
Pt called stating that she is diabetic and feels as if she has a yeast infection. She is experiencing vaginal discharge and itching. Donette Larry, CNM said to send in 2 Diflucan and have her take 1 today and the other in 3 days. PT expressed understanding. Pharmacy verified as [facility] [address] and Rx sent.

## 2019-03-24 DIAGNOSIS — Z23 Encounter for immunization: Secondary | ICD-10-CM | POA: Diagnosis not present

## 2019-04-29 DIAGNOSIS — L900 Lichen sclerosus et atrophicus: Secondary | ICD-10-CM | POA: Diagnosis not present

## 2019-04-29 DIAGNOSIS — Z0000 Encounter for general adult medical examination without abnormal findings: Secondary | ICD-10-CM | POA: Diagnosis not present

## 2019-04-29 DIAGNOSIS — E10618 Type 1 diabetes mellitus with other diabetic arthropathy: Secondary | ICD-10-CM | POA: Diagnosis not present

## 2019-04-29 DIAGNOSIS — Z23 Encounter for immunization: Secondary | ICD-10-CM | POA: Diagnosis not present

## 2019-05-05 DIAGNOSIS — Z0000 Encounter for general adult medical examination without abnormal findings: Secondary | ICD-10-CM | POA: Diagnosis not present

## 2019-05-06 DIAGNOSIS — E109 Type 1 diabetes mellitus without complications: Secondary | ICD-10-CM | POA: Diagnosis not present

## 2019-05-06 DIAGNOSIS — Z794 Long term (current) use of insulin: Secondary | ICD-10-CM | POA: Diagnosis not present

## 2019-08-05 DIAGNOSIS — E109 Type 1 diabetes mellitus without complications: Secondary | ICD-10-CM | POA: Diagnosis not present

## 2019-08-05 DIAGNOSIS — Z794 Long term (current) use of insulin: Secondary | ICD-10-CM | POA: Diagnosis not present

## 2019-08-18 DIAGNOSIS — Z9641 Presence of insulin pump (external) (internal): Secondary | ICD-10-CM | POA: Diagnosis not present

## 2019-08-18 DIAGNOSIS — E10649 Type 1 diabetes mellitus with hypoglycemia without coma: Secondary | ICD-10-CM | POA: Diagnosis not present

## 2019-08-18 DIAGNOSIS — L608 Other nail disorders: Secondary | ICD-10-CM | POA: Diagnosis not present

## 2019-08-18 DIAGNOSIS — E109 Type 1 diabetes mellitus without complications: Secondary | ICD-10-CM | POA: Diagnosis not present

## 2019-08-18 DIAGNOSIS — Z794 Long term (current) use of insulin: Secondary | ICD-10-CM | POA: Diagnosis not present

## 2019-08-18 DIAGNOSIS — E039 Hypothyroidism, unspecified: Secondary | ICD-10-CM | POA: Diagnosis not present

## 2019-08-18 DIAGNOSIS — Z79890 Hormone replacement therapy (postmenopausal): Secondary | ICD-10-CM | POA: Diagnosis not present

## 2019-08-28 DIAGNOSIS — R350 Frequency of micturition: Secondary | ICD-10-CM | POA: Diagnosis not present

## 2019-08-28 DIAGNOSIS — Z23 Encounter for immunization: Secondary | ICD-10-CM | POA: Diagnosis not present

## 2019-08-28 DIAGNOSIS — S61432A Puncture wound without foreign body of left hand, initial encounter: Secondary | ICD-10-CM | POA: Diagnosis not present

## 2019-08-28 DIAGNOSIS — W260XXA Contact with knife, initial encounter: Secondary | ICD-10-CM | POA: Diagnosis not present

## 2019-08-28 DIAGNOSIS — R82998 Other abnormal findings in urine: Secondary | ICD-10-CM | POA: Diagnosis not present

## 2019-10-30 DIAGNOSIS — E109 Type 1 diabetes mellitus without complications: Secondary | ICD-10-CM | POA: Diagnosis not present

## 2019-10-30 DIAGNOSIS — Z794 Long term (current) use of insulin: Secondary | ICD-10-CM | POA: Diagnosis not present

## 2020-02-02 DIAGNOSIS — E109 Type 1 diabetes mellitus without complications: Secondary | ICD-10-CM | POA: Diagnosis not present

## 2020-02-02 DIAGNOSIS — Z794 Long term (current) use of insulin: Secondary | ICD-10-CM | POA: Diagnosis not present

## 2020-02-16 DIAGNOSIS — Z882 Allergy status to sulfonamides status: Secondary | ICD-10-CM | POA: Diagnosis not present

## 2020-02-16 DIAGNOSIS — E039 Hypothyroidism, unspecified: Secondary | ICD-10-CM | POA: Diagnosis not present

## 2020-02-16 DIAGNOSIS — E109 Type 1 diabetes mellitus without complications: Secondary | ICD-10-CM | POA: Diagnosis not present

## 2020-02-16 DIAGNOSIS — E10649 Type 1 diabetes mellitus with hypoglycemia without coma: Secondary | ICD-10-CM | POA: Diagnosis not present

## 2020-02-16 DIAGNOSIS — L603 Nail dystrophy: Secondary | ICD-10-CM | POA: Diagnosis not present

## 2020-03-01 DIAGNOSIS — Z794 Long term (current) use of insulin: Secondary | ICD-10-CM | POA: Diagnosis not present

## 2020-03-01 DIAGNOSIS — E109 Type 1 diabetes mellitus without complications: Secondary | ICD-10-CM | POA: Diagnosis not present

## 2020-03-10 ENCOUNTER — Ambulatory Visit: Payer: Managed Care, Other (non HMO) | Admitting: Medical-Surgical

## 2020-03-21 ENCOUNTER — Ambulatory Visit: Payer: Managed Care, Other (non HMO) | Admitting: Obstetrics & Gynecology

## 2020-03-28 ENCOUNTER — Other Ambulatory Visit: Payer: Self-pay

## 2020-03-28 ENCOUNTER — Other Ambulatory Visit (HOSPITAL_COMMUNITY)
Admission: RE | Admit: 2020-03-28 | Discharge: 2020-03-28 | Disposition: A | Discharge status: Home / Self Care | Payer: BC Managed Care – PPO | Source: Ambulatory Visit | Attending: Obstetrics & Gynecology | Admitting: Obstetrics & Gynecology

## 2020-03-28 ENCOUNTER — Encounter: Payer: Self-pay | Admitting: Obstetrics & Gynecology

## 2020-03-28 ENCOUNTER — Ambulatory Visit (INDEPENDENT_AMBULATORY_CARE_PROVIDER_SITE_OTHER): Payer: BC Managed Care – PPO | Admitting: Obstetrics & Gynecology

## 2020-03-28 VITALS — BP 114/60 | HR 82 | Ht 67.0 in | Wt 152.0 lb

## 2020-03-28 DIAGNOSIS — L900 Lichen sclerosus et atrophicus: Secondary | ICD-10-CM | POA: Diagnosis not present

## 2020-03-28 DIAGNOSIS — Z01419 Encounter for gynecological examination (general) (routine) without abnormal findings: Secondary | ICD-10-CM

## 2020-03-28 MED ORDER — CLOBETASOL PROPIONATE 0.05 % EX OINT: TOPICAL_OINTMENT | CUTANEOUS | 1 refills | Status: DC

## 2020-03-28 NOTE — Patient Instructions (Signed)
[facility]--freeze dried aloe Follow directions on the bottle  Increase clobetasol to daily for 3 weeks then try to ween back down to twice a week.  If still having "stinging" please make appt.

## 2020-03-28 NOTE — Progress Notes (Signed)
Last pap- 10/09/17- negative Last mam- 10/11/17- normal

## 2020-03-28 NOTE — Progress Notes (Signed)
Subjective:     Tanya Schmidt is a 64 y.o. female here for a routine exam.  Current complaints: small amount of burning at introitus near.     Gynecologic History No LMP recorded. Patient is postmenopausal. Contraception: post menopausal status Last Pap: 09/2017. Results were: normal Last mammogram: 2019. Results were: normal  Obstetric History OB History  Gravida Para Term Preterm AB Living  3 2 2     2   SAB TAB Ectopic Multiple Live Births               # Outcome Date GA Lbr Len/2nd Weight Sex Delivery Anes PTL Lv  3 Gravida           2 Term    11 lb 3 oz (5.075 kg)  CS-Unspec     1 Term    8 lb 3 oz (3.714 kg)  CS-Unspec        The following portions of the patient's history were reviewed and updated as appropriate: allergies, current medications, past family history, past medical history, past social history, past surgical history and problem list.  Review of Systems Pertinent items noted in HPI and remainder of comprehensive ROS otherwise negative.    Objective:      Vitals:   03/28/20 0835  BP: 114/60  Pulse: 82  Weight: 152 lb (68.9 kg)  Height: 5\' 7"  (1.702 m)   Vitals:  WNL General appearance: alert, cooperative and no distress  HEENT: Normocephalic, without obvious abnormality, atraumatic Eyes: negative Throat: lips, mucosa, and tongue normal; teeth and gums normal  Respiratory: Clear to auscultation bilaterally  CV: Regular rate and rhythm  Breasts:  Normal appearance, no masses or tenderness, no nipple retraction or dimpling  GI: Soft, non-tender; bowel sounds normal; no masses,  no organomegaly  GU: External Genitalia:  Tanner V, no lesion Urethra:  No prolapse   Vagina: Pink, normal rugae, no blood or discharge  Cervix: No CMT, no lesion  Uterus:  Normal size and contour, non tender  Adnexa: Normal, no masses, non tender  Musculoskeletal: No edema, redness or tenderness in the calves or thighs  Skin: No lesions or rash  Lymphatic: Axillary  adenopathy: none     Psychiatric: Normal mood and behavior          Assessment:    Healthy female exam LS mild flare based on symptoms.     Plan:  Increase clobetasol daily for 3 weeks then ween to twice a week.   Pap with co testing Yearly mammograms Sent note to patient about last bone density.  Stopped hormones about 2.5 years ago.

## 2020-03-29 LAB — CYTOLOGY - PAP
Comment: NEGATIVE
Diagnosis: NEGATIVE
High risk HPV: NEGATIVE

## 2020-05-04 DIAGNOSIS — E109 Type 1 diabetes mellitus without complications: Secondary | ICD-10-CM | POA: Diagnosis not present

## 2020-05-04 DIAGNOSIS — Z794 Long term (current) use of insulin: Secondary | ICD-10-CM | POA: Diagnosis not present

## 2020-05-09 ENCOUNTER — Encounter: Payer: Self-pay | Admitting: Medical-Surgical

## 2020-05-09 ENCOUNTER — Ambulatory Visit (INDEPENDENT_AMBULATORY_CARE_PROVIDER_SITE_OTHER): Payer: BC Managed Care – PPO | Admitting: Medical-Surgical

## 2020-05-09 VITALS — BP 113/68 | HR 77 | Temp 97.9°F | Ht 66.5 in | Wt 152.5 lb

## 2020-05-09 DIAGNOSIS — Z1211 Encounter for screening for malignant neoplasm of colon: Secondary | ICD-10-CM | POA: Diagnosis not present

## 2020-05-09 DIAGNOSIS — Z7689 Persons encountering health services in other specified circumstances: Secondary | ICD-10-CM | POA: Diagnosis not present

## 2020-05-09 DIAGNOSIS — Z114 Encounter for screening for human immunodeficiency virus [HIV]: Secondary | ICD-10-CM

## 2020-05-09 DIAGNOSIS — Z0000 Encounter for general adult medical examination without abnormal findings: Secondary | ICD-10-CM

## 2020-05-09 DIAGNOSIS — Z1159 Encounter for screening for other viral diseases: Secondary | ICD-10-CM

## 2020-05-09 DIAGNOSIS — E109 Type 1 diabetes mellitus without complications: Secondary | ICD-10-CM | POA: Insufficient documentation

## 2020-05-09 NOTE — Progress Notes (Signed)
New Patient Office Visit  Subjective:  Patient ID: Tanya Schmidt, female    DOB: [birth year]  Age: 64 y.o. MRN: [id number]  CC:  Chief Complaint  Patient presents with  . Establish Care    HPI Tanya Schmidt presents to establish care.   DM- diagnosed at age 73. Managed by endocrinology.   Hypothyroid- Taking levothyroxine daily. Managed by endocrinology.   Endometriosis- sees OB/GYN. Cramping at the bladder/lower abdomen later at night. Recommended freeze dried aloe vera. Symptoms have improved since starting that.   Dentist: every 6 months,  Eye: January 2021 Exercise: none intentional Diet: diabetic diet Mammogram: ordered but not completed yet   Past Medical History:  Diagnosis Date  . Diabetes mellitus without complication (HCC)   . Endometriosis   . Hypothyroid   . Lichen sclerosus     Past Surgical History:  Procedure Laterality Date  . CESAREAN SECTION    . LAPAROSCOPY    . SHOULDER SURGERY Left   . WISDOM TOOTH EXTRACTION      Family History  Problem Relation Age of Onset  . Hypertension Mother   . Skin cancer Mother   . Brain cancer Father   . Colon cancer Father   . Mental illness Maternal Grandmother     Social History   Socioeconomic History  . Marital status: Married    Spouse name: Not on file  . Number of children: Not on file  . Years of education: Not on file  . Highest education level: Not on file  Occupational History  . Not on file  Tobacco Use  . Smoking status: Never Smoker  . Smokeless tobacco: Never Used  Vaping Use  . Vaping Use: Never used  Substance and Sexual Activity  . Alcohol use: Yes    Alcohol/week: 2.0 standard drinks    Types: 2 Standard drinks or equivalent per week  . Drug use: Never  . Sexual activity: Yes    Partners: Male    Birth control/protection: None  Other Topics Concern  . Not on file  Social History Narrative  . Not on file   Social Determinants of Health   Financial Resource  Strain:   . Difficulty of Paying Living Expenses: Not on file  Food Insecurity:   . Worried About Programme researcher, broadcasting/film/video in the Last Year: Not on file  . Ran Out of Food in the Last Year: Not on file  Transportation Needs:   . Lack of Transportation (Medical): Not on file  . Lack of Transportation (Non-Medical): Not on file  Physical Activity:   . Days of Exercise per Week: Not on file  . Minutes of Exercise per Session: Not on file  Stress:   . Feeling of Stress : Not on file  Social Connections:   . Frequency of Communication with Friends and Family: Not on file  . Frequency of Social Gatherings with Friends and Family: Not on file  . Attends Religious Services: Not on file  . Active Member of Clubs or Organizations: Not on file  . Attends Banker Meetings: Not on file  . Marital Status: Not on file  Intimate Partner Violence:   . Fear of Current or Ex-Partner: Not on file  . Emotionally Abused: Not on file  . Physically Abused: Not on file  . Sexually Abused: Not on file    ROS Review of Systems  Constitutional: Negative for chills, fatigue, fever and unexpected weight change.  HENT: Positive for  sneezing. Negative for congestion, rhinorrhea, sinus pressure and sore throat.   Eyes: Negative for visual disturbance.  Respiratory: Negative for cough, chest tightness, shortness of breath and wheezing.   Cardiovascular: Negative for chest pain, palpitations and leg swelling.  Gastrointestinal: Negative for abdominal pain, constipation, diarrhea, nausea and vomiting.  Endocrine: Negative for cold intolerance, heat intolerance, polydipsia, polyphagia and polyuria.  Genitourinary: Negative for dysuria, frequency, hematuria and urgency.       Nocturia 2-3 times nightly.  Skin:       Lichen sclerosus- using clobetasol topically   Allergic/Immunologic: Positive for environmental allergies.  Neurological: Negative for dizziness, light-headedness and headaches.   Psychiatric/Behavioral: Positive for sleep disturbance (nocturia). Negative for dysphoric mood, self-injury and suicidal ideas. The patient is not nervous/anxious.     Objective:   Today's Vitals: BP 113/68   Pulse 77   Temp 97.9 F (36.6 C) (Oral)   Ht 5' 6.5" (1.689 m)   Wt 152 lb 8 oz (69.2 kg)   SpO2 100%   BMI 24.25 kg/m   Physical Exam Constitutional:      General: She is not in acute distress.    Appearance: Normal appearance. She is normal weight. She is not ill-appearing.  HENT:     Head: Normocephalic and atraumatic.     Right Ear: Tympanic membrane, ear canal and external ear normal. There is no impacted cerumen.     Left Ear: Tympanic membrane, ear canal and external ear normal.  Eyes:     General:        Right eye: No discharge.        Left eye: No discharge.     Conjunctiva/sclera: Conjunctivae normal.     Pupils: Pupils are equal, round, and reactive to light.  Neck:     Vascular: No carotid bruit.  Cardiovascular:     Rate and Rhythm: Normal rate and regular rhythm.     Pulses: Normal pulses.     Heart sounds: Normal heart sounds. No murmur heard.  No friction rub. No gallop.   Pulmonary:     Effort: Pulmonary effort is normal. No respiratory distress.     Breath sounds: Normal breath sounds. No wheezing or rhonchi.  Abdominal:     General: Bowel sounds are normal. There is no distension.     Palpations: Abdomen is soft. There is no mass.     Tenderness: There is no abdominal tenderness. There is no guarding or rebound.     Hernia: No hernia is present.  Musculoskeletal:        General: Normal range of motion.     Cervical back: Normal range of motion and neck supple. No rigidity or tenderness.  Lymphadenopathy:     Cervical: No cervical adenopathy.  Skin:    General: Skin is warm and dry.  Neurological:     Mental Status: She is alert and oriented to person, place, and time.     Cranial Nerves: No cranial nerve deficit.  Psychiatric:         Mood and Affect: Mood normal.        Behavior: Behavior normal.        Thought Content: Thought content normal.        Judgment: Judgment normal.     Assessment & Plan:   1. Encounter to establish care Reviewed available information and discussed health care concerns with patient.  2. Annual physical exam Blood work regularly checked with endocrinology.  Patient would like to wait until  her appointment with them next month to have her labs drawn.  3. Screening for colon cancer Referring to GI for colonoscopy. - Ambulatory referral to Gastroenterology  4. Need for hepatitis C/HIV screening test Discussed screening recommendations.  She is agreeable so we will order this today for collection with the next blood draw with [place]. - Hepatitis C antibody  Outpatient Encounter Medications as of 05/09/2020  Medication Sig  . cetirizine (ZYRTEC) 10 MG tablet Take 10 mg by mouth daily.  . clobetasol ointment (TEMOVATE) 0.05 % Use as directed on after visit summary  . glucagon (GLUCAGON EMERGENCY) 1 MG injection Used as directed for severe hypoglycemia  . Insulin Aspart, w/Niacinamide, (FIASP) 100 UNIT/ML SOLN Inject via insulin pump up to 70 units daily  . Lancets (ONETOUCH ULTRASOFT) lancets Testing 4 times daily  . levothyroxine (SYNTHROID) 75 MCG tablet TAKE 1 TABLET BY MOUTH DAILY  . [DISCONTINUED] insulin lispro (HUMALOG) 100 UNIT/ML injection by pump route 30 (thirty) minutes before meals. (Patient not taking: Reported on 05/09/2020)   No facility-administered encounter medications on file as of 05/09/2020.    Follow-up: Return in about 1 year (around 05/09/2021) for annual physical exam or sooner if needed.   Thayer Ohm, DNP, APRN, FNP-BC [facility] [address] [facility]

## 2020-05-27 DIAGNOSIS — E109 Type 1 diabetes mellitus without complications: Secondary | ICD-10-CM | POA: Diagnosis not present

## 2020-05-27 DIAGNOSIS — Z794 Long term (current) use of insulin: Secondary | ICD-10-CM | POA: Diagnosis not present

## 2020-05-31 DIAGNOSIS — Z9641 Presence of insulin pump (external) (internal): Secondary | ICD-10-CM | POA: Diagnosis not present

## 2020-05-31 DIAGNOSIS — E109 Type 1 diabetes mellitus without complications: Secondary | ICD-10-CM | POA: Diagnosis not present

## 2020-05-31 DIAGNOSIS — Z794 Long term (current) use of insulin: Secondary | ICD-10-CM | POA: Diagnosis not present

## 2020-05-31 DIAGNOSIS — E039 Hypothyroidism, unspecified: Secondary | ICD-10-CM | POA: Diagnosis not present

## 2020-05-31 DIAGNOSIS — E1065 Type 1 diabetes mellitus with hyperglycemia: Secondary | ICD-10-CM | POA: Diagnosis not present

## 2020-07-15 ENCOUNTER — Encounter: Payer: Self-pay | Admitting: Medical-Surgical

## 2020-08-02 DIAGNOSIS — E109 Type 1 diabetes mellitus without complications: Secondary | ICD-10-CM | POA: Diagnosis not present

## 2020-08-02 DIAGNOSIS — Z794 Long term (current) use of insulin: Secondary | ICD-10-CM | POA: Diagnosis not present

## 2020-08-25 DIAGNOSIS — Z794 Long term (current) use of insulin: Secondary | ICD-10-CM | POA: Diagnosis not present

## 2020-08-25 DIAGNOSIS — E109 Type 1 diabetes mellitus without complications: Secondary | ICD-10-CM | POA: Diagnosis not present

## 2020-09-07 DIAGNOSIS — Z794 Long term (current) use of insulin: Secondary | ICD-10-CM | POA: Diagnosis not present

## 2020-09-07 DIAGNOSIS — E109 Type 1 diabetes mellitus without complications: Secondary | ICD-10-CM | POA: Diagnosis not present

## 2020-09-07 DIAGNOSIS — E039 Hypothyroidism, unspecified: Secondary | ICD-10-CM | POA: Diagnosis not present

## 2020-09-07 DIAGNOSIS — Z79899 Other long term (current) drug therapy: Secondary | ICD-10-CM | POA: Diagnosis not present

## 2020-09-07 DIAGNOSIS — Z9641 Presence of insulin pump (external) (internal): Secondary | ICD-10-CM | POA: Diagnosis not present

## 2020-09-07 LAB — MICROALBUMIN, URINE: Microalb, Ur: NORMAL

## 2020-10-28 DIAGNOSIS — E109 Type 1 diabetes mellitus without complications: Secondary | ICD-10-CM | POA: Diagnosis not present

## 2020-10-28 DIAGNOSIS — Z794 Long term (current) use of insulin: Secondary | ICD-10-CM | POA: Diagnosis not present

## 2020-11-08 ENCOUNTER — Encounter: Payer: Self-pay | Admitting: Medical-Surgical

## 2020-11-08 ENCOUNTER — Ambulatory Visit (INDEPENDENT_AMBULATORY_CARE_PROVIDER_SITE_OTHER): Payer: BC Managed Care – PPO | Admitting: Medical-Surgical

## 2020-11-08 ENCOUNTER — Other Ambulatory Visit: Payer: Self-pay

## 2020-11-08 VITALS — BP 116/65 | HR 64 | Temp 97.6°F | Ht 66.5 in | Wt 151.0 lb

## 2020-11-08 DIAGNOSIS — R300 Dysuria: Secondary | ICD-10-CM | POA: Diagnosis not present

## 2020-11-08 DIAGNOSIS — M549 Dorsalgia, unspecified: Secondary | ICD-10-CM

## 2020-11-08 LAB — POCT URINALYSIS DIP (CLINITEK)
Bilirubin, UA: NEGATIVE
Blood, UA: NEGATIVE
Glucose, UA: NEGATIVE mg/dL
Ketones, POC UA: NEGATIVE mg/dL
Nitrite, UA: NEGATIVE
POC PROTEIN,UA: NEGATIVE
Spec Grav, UA: 1.01 (ref 1.010–1.025)
Urobilinogen, UA: 0.2 E.U./dL
pH, UA: 6 (ref 5.0–8.0)

## 2020-11-08 MED ORDER — NITROFURANTOIN MONOHYD MACRO 100 MG PO CAPS: 100.0000 mg | ORAL_CAPSULE | Freq: Two times a day (BID) | ORAL | 0 refills | Status: DC

## 2020-11-08 NOTE — Progress Notes (Signed)
Subjective:    CC: Back pain, dysuria  HPI: Pleasant 65 year old female presenting today for evaluation of low back pain and dysuria.  Notes that for the last several days she has had some increasing low back pain and has noticed that her sugars are running high.  She has had to increase her insulin use by 30%.  She has also had some intermittent nausea but no vomiting.  Endorses chills but no documented fevers.  Notes her urine has had a very strong odor and smells weird.  Unclear whether she has increased frequency or urgency since she is type I diabetic and this varies widely.  History of interstitial cystitis but notes this feels different.  I reviewed the past medical history, family history, social history, surgical history, and allergies today and no changes were needed.  Please see the problem list section below in epic for further details.  Past Medical History: Past Medical History:  Diagnosis Date  . Diabetes mellitus without complication (HCC)   . Endometriosis   . Hypothyroid   . Lichen sclerosus    Past Surgical History: Past Surgical History:  Procedure Laterality Date  . CESAREAN SECTION    . LAPAROSCOPY    . SHOULDER SURGERY Left   . WISDOM TOOTH EXTRACTION     Social History: Social History   Socioeconomic History  . Marital status: Married    Spouse name: Not on file  . Number of children: Not on file  . Years of education: Not on file  . Highest education level: Not on file  Occupational History  . Not on file  Tobacco Use  . Smoking status: Never Smoker  . Smokeless tobacco: Never Used  Vaping Use  . Vaping Use: Never used  Substance and Sexual Activity  . Alcohol use: Yes    Alcohol/week: 2.0 standard drinks    Types: 2 Standard drinks or equivalent per week  . Drug use: Never  . Sexual activity: Yes    Partners: Male    Birth control/protection: None  Other Topics Concern  . Not on file  Social History Narrative  . Not on file   Social  Determinants of Health   Financial Resource Strain: Not on file  Food Insecurity: Not on file  Transportation Needs: Not on file  Physical Activity: Not on file  Stress: Not on file  Social Connections: Not on file   Family History: Family History  Problem Relation Age of Onset  . Hypertension Mother   . Skin cancer Mother   . Brain cancer Father   . Colon cancer Father   . Mental illness Maternal Grandmother    Allergies: Allergies  Allergen Reactions  . Sulfa Antibiotics Rash   Medications: See med rec.  Review of Systems: See HPI for pertinent positives and negatives.   Objective:    General: Well Developed, well nourished, and in no acute distress.  Neuro: Alert and oriented x3.  HEENT: Normocephalic, atraumatic.  Skin: Warm and dry. Cardiac: Regular rate and rhythm, no murmurs rubs or gallops, no lower extremity edema.  Respiratory: Clear to auscultation bilaterally. Not using accessory muscles, speaking in full sentences. Abdomen: Soft, nontender, nondistended. Bowel sounds + x 4 quadrants. No HSM appreciated.  No CVA tenderness   Impression and Recommendations:    1. Back pain, unspecified back location, unspecified back pain laterality, unspecified chronicity POCT UA positive for small leukocytes but otherwise negative.  Sending for culture - POCT URINALYSIS DIP (CLINITEK) - Urine Culture  2.  Dysuria Given her nausea as well as increased blood sugars, concern for UTI.  We will go ahead and treat her empirically with Macrobid 100 mg twice daily x5 days while waiting on her culture results.  Advised the patient that we will once her culture results are available, this will help direct our treatment.  If positive for UTI, we may need to switch antibiotics depending on sensitivities.  If negative for UTI, will plan to stop the Macrobid.  Continue pushing fluid and monitor for the development of other symptoms.  Return if symptoms worsen or fail to  improve. ___________________________________________ Thayer Ohm, DNP, APRN, FNP-BC [facility]

## 2020-11-10 LAB — URINE CULTURE
MICRO NUMBER:: 11902521
Result:: NO GROWTH
SPECIMEN QUALITY:: ADEQUATE

## 2020-11-23 DIAGNOSIS — Z794 Long term (current) use of insulin: Secondary | ICD-10-CM | POA: Diagnosis not present

## 2020-11-23 DIAGNOSIS — E109 Type 1 diabetes mellitus without complications: Secondary | ICD-10-CM | POA: Diagnosis not present

## 2020-12-12 DIAGNOSIS — Z794 Long term (current) use of insulin: Secondary | ICD-10-CM | POA: Diagnosis not present

## 2020-12-12 DIAGNOSIS — E109 Type 1 diabetes mellitus without complications: Secondary | ICD-10-CM | POA: Diagnosis not present

## 2020-12-30 DIAGNOSIS — Z794 Long term (current) use of insulin: Secondary | ICD-10-CM | POA: Diagnosis not present

## 2020-12-30 DIAGNOSIS — E109 Type 1 diabetes mellitus without complications: Secondary | ICD-10-CM | POA: Diagnosis not present

## 2021-02-14 DIAGNOSIS — E109 Type 1 diabetes mellitus without complications: Secondary | ICD-10-CM | POA: Diagnosis not present

## 2021-02-14 DIAGNOSIS — Z794 Long term (current) use of insulin: Secondary | ICD-10-CM | POA: Diagnosis not present

## 2021-02-20 DIAGNOSIS — E109 Type 1 diabetes mellitus without complications: Secondary | ICD-10-CM | POA: Diagnosis not present

## 2021-02-20 DIAGNOSIS — Z794 Long term (current) use of insulin: Secondary | ICD-10-CM | POA: Diagnosis not present

## 2021-02-21 DIAGNOSIS — Z794 Long term (current) use of insulin: Secondary | ICD-10-CM | POA: Diagnosis not present

## 2021-02-21 DIAGNOSIS — E109 Type 1 diabetes mellitus without complications: Secondary | ICD-10-CM | POA: Diagnosis not present

## 2021-03-02 ENCOUNTER — Ambulatory Visit: Payer: BC Managed Care – PPO | Admitting: Obstetrics and Gynecology

## 2021-03-09 DIAGNOSIS — E785 Hyperlipidemia, unspecified: Secondary | ICD-10-CM | POA: Diagnosis not present

## 2021-03-09 DIAGNOSIS — E039 Hypothyroidism, unspecified: Secondary | ICD-10-CM | POA: Diagnosis not present

## 2021-03-09 DIAGNOSIS — Z9641 Presence of insulin pump (external) (internal): Secondary | ICD-10-CM | POA: Diagnosis not present

## 2021-03-09 DIAGNOSIS — E1065 Type 1 diabetes mellitus with hyperglycemia: Secondary | ICD-10-CM | POA: Diagnosis not present

## 2021-03-09 DIAGNOSIS — Z794 Long term (current) use of insulin: Secondary | ICD-10-CM | POA: Diagnosis not present

## 2021-03-09 DIAGNOSIS — E109 Type 1 diabetes mellitus without complications: Secondary | ICD-10-CM | POA: Diagnosis not present

## 2021-03-09 LAB — HM DIABETES FOOT EXAM: HM Diabetic Foot Exam: NORMAL

## 2021-03-09 LAB — HEMOGLOBIN A1C: Hemoglobin A1C: 6.6

## 2021-03-15 DIAGNOSIS — Z794 Long term (current) use of insulin: Secondary | ICD-10-CM | POA: Diagnosis not present

## 2021-03-15 DIAGNOSIS — E109 Type 1 diabetes mellitus without complications: Secondary | ICD-10-CM | POA: Diagnosis not present

## 2021-03-29 DIAGNOSIS — E109 Type 1 diabetes mellitus without complications: Secondary | ICD-10-CM | POA: Diagnosis not present

## 2021-03-30 DIAGNOSIS — E109 Type 1 diabetes mellitus without complications: Secondary | ICD-10-CM | POA: Diagnosis not present

## 2021-03-30 DIAGNOSIS — Z794 Long term (current) use of insulin: Secondary | ICD-10-CM | POA: Diagnosis not present

## 2021-04-20 ENCOUNTER — Other Ambulatory Visit: Payer: Self-pay

## 2021-04-20 ENCOUNTER — Ambulatory Visit (INDEPENDENT_AMBULATORY_CARE_PROVIDER_SITE_OTHER): Payer: BC Managed Care – PPO

## 2021-04-20 DIAGNOSIS — Z1231 Encounter for screening mammogram for malignant neoplasm of breast: Secondary | ICD-10-CM | POA: Diagnosis not present

## 2021-04-20 DIAGNOSIS — Z01419 Encounter for gynecological examination (general) (routine) without abnormal findings: Secondary | ICD-10-CM

## 2021-05-12 ENCOUNTER — Other Ambulatory Visit: Payer: Self-pay

## 2021-05-12 ENCOUNTER — Ambulatory Visit (INDEPENDENT_AMBULATORY_CARE_PROVIDER_SITE_OTHER): Payer: BC Managed Care – PPO | Admitting: Medical-Surgical

## 2021-05-12 ENCOUNTER — Encounter: Payer: Self-pay | Admitting: Medical-Surgical

## 2021-05-12 VITALS — BP 121/66 | HR 75 | Resp 20 | Ht 66.5 in | Wt 147.5 lb

## 2021-05-12 DIAGNOSIS — F5101 Primary insomnia: Secondary | ICD-10-CM

## 2021-05-12 DIAGNOSIS — Z0000 Encounter for general adult medical examination without abnormal findings: Secondary | ICD-10-CM

## 2021-05-12 DIAGNOSIS — Z794 Long term (current) use of insulin: Secondary | ICD-10-CM | POA: Diagnosis not present

## 2021-05-12 DIAGNOSIS — E109 Type 1 diabetes mellitus without complications: Secondary | ICD-10-CM | POA: Diagnosis not present

## 2021-05-12 NOTE — Patient Instructions (Signed)
Preventive Care 63-63 Years Old, Female Preventive care refers to lifestyle choices and visits with your health care provider that can promote health and wellness. Preventive care visits are also called wellness exams. What can I expect for my preventive care visit? Counseling Your health care provider may ask you questions about your: Medical history, including: Past medical problems. Family medical history. Pregnancy history. Current health, including: Menstrual cycle. Method of birth control. Emotional well-being. Home life and relationship well-being. Sexual activity and sexual health. Lifestyle, including: Alcohol, nicotine or tobacco, and drug use. Access to firearms. Diet, exercise, and sleep habits. Work and work Statistician. Sunscreen use. Safety issues such as seatbelt and bike helmet use. Physical exam Your health care provider will check your: Height and weight. These may be used to calculate your BMI (body mass index). BMI is a measurement that tells if you are at a healthy weight. Waist circumference. This measures the distance around your waistline. This measurement also tells if you are at a healthy weight and may help predict your risk of certain diseases, such as type 2 diabetes and high blood pressure. Heart rate and blood pressure. Body temperature. Skin for abnormal spots. What immunizations do I need? Vaccines are usually given at various ages, according to a schedule. Your health care provider will recommend vaccines for you based on your age, medical history, and lifestyle or other factors, such as travel or where you work. What tests do I need? Screening Your health care provider may recommend screening tests for certain conditions. This may include: Lipid and cholesterol levels. Diabetes screening. This is done by checking your blood sugar (glucose) after you have not eaten for a while (fasting). Pelvic exam and Pap test. Hepatitis B test. Hepatitis C  test. HIV (human immunodeficiency virus) test. STI (sexually transmitted infection) testing, if you are at risk. Lung cancer screening. Colorectal cancer screening. Mammogram. Talk with your health care provider about when you should start having regular mammograms. This may depend on whether you have a family history of breast cancer. BRCA-related cancer screening. This may be done if you have a family history of breast, ovarian, tubal, or peritoneal cancers. Bone density scan. This is done to screen for osteoporosis. Talk with your health care provider about your test results, treatment options, and if necessary, the need for more tests. Follow these instructions at home: Eating and drinking  Eat a diet that includes fresh fruits and vegetables, whole grains, lean protein, and low-fat dairy products. Take vitamin and mineral supplements as recommended by your health care provider. Do not drink alcohol if: Your health care provider tells you not to drink. You are pregnant, may be pregnant, or are planning to become pregnant. If you drink alcohol: Limit how much you have to 0-1 drink a day. Know how much alcohol is in your drink. In the [place] one drink equals one 12 oz bottle of beer (355 mL), one 5 oz glass of wine (148 mL), or one 1 oz glass of hard liquor (44 mL). Lifestyle Brush your teeth every morning and night with fluoride toothpaste. Floss one time each day. Exercise for at least 30 minutes 5 or more days each week. Do not use any products that contain nicotine or tobacco. These products include cigarettes, chewing tobacco, and vaping devices, such as e-cigarettes. If you need help quitting, ask your health care provider. Do not use drugs. If you are sexually active, practice safe sex. Use a condom or other form of protection to prevent  STIs. If you do not wish to become pregnant, use a form of birth control. If you plan to become pregnant, see your health care provider for a  prepregnancy visit. Take aspirin only as told by your health care provider. Make sure that you understand how much to take and what form to take. Work with your health care provider to find out whether it is safe and beneficial for you to take aspirin daily. Find healthy ways to manage stress, such as: Meditation, yoga, or listening to music. Journaling. Talking to a trusted person. Spending time with friends and family. Minimize exposure to UV radiation to reduce your risk of skin cancer. Safety Always wear your seat belt while driving or riding in a vehicle. Do not drive: If you have been drinking alcohol. Do not ride with someone who has been drinking. When you are tired or distracted. While texting. If you have been using any mind-altering substances or drugs. Wear a helmet and other protective equipment during sports activities. If you have firearms in your house, make sure you follow all gun safety procedures. Seek help if you have been physically or sexually abused. What's next? Visit your health care provider once a year for an annual wellness visit. Ask your health care provider how often you should have your eyes and teeth checked. Stay up to date on all vaccines. This information is not intended to replace advice given to you by your health care provider. Make sure you discuss any questions you have with your health care provider. Document Revised: 12/07/2020 Document Reviewed: 12/07/2020 Elsevier Patient Education  [place].

## 2021-05-12 NOTE — Progress Notes (Signed)
HPI: Tanya Schmidt is a 65 y.o. female who  has a past medical history of Diabetes mellitus without complication ([place]), Endometriosis, Hypothyroid, and Lichen sclerosus.  she presents to [facility] today, 05/12/21,  for chief complaint of: Annual physical exam  Dentist: UTD Eye exam: UTD Exercise: None intentional Diet: Diabetic diet, carb counting Pap smear: Sees OB/GYN Mammogram: UTD Colon cancer screening: UTD COVID vaccine: UTD  Concerns: Insomnia-having difficulty with sleeping through the night and often wakes up around 3:30 AM to 4 AM with difficulty getting back to sleep.  Has not tried any medications or over-the-counter remedies to help with this.  Past medical, surgical, social and family history reviewed:  Patient Active Problem List   Diagnosis Date Noted   Diabetes mellitus type I ([place]) 16/03/9603   Lichen sclerosus 54/02/8118   Adult hypothyroidism 05/21/2011    Past Surgical History:  Procedure Laterality Date   CESAREAN SECTION     LAPAROSCOPY     SHOULDER SURGERY Left    WISDOM TOOTH EXTRACTION      Social History   Tobacco Use   Smoking status: Never   Smokeless tobacco: Never  Substance Use Topics   Alcohol use: Yes    Alcohol/week: 2.0 standard drinks    Types: 2 Standard drinks or equivalent per week    Family History  Problem Relation Age of Onset   Hypertension Mother    Skin cancer Mother    Brain cancer Father    Colon cancer Father    Mental illness Maternal Grandmother      Current medication list and allergy/intolerance information reviewed:    Current Outpatient Medications  Medication Sig Dispense Refill   cetirizine (ZYRTEC) 10 MG tablet Take 10 mg by mouth daily.     glucagon 1 MG injection Used as directed for severe hypoglycemia     Lancets (ONETOUCH ULTRASOFT) lancets Testing 4 times daily     levothyroxine (SYNTHROID) 75 MCG tablet TAKE 1 TABLET BY MOUTH DAILY     No current  facility-administered medications for this visit.    Allergies  Allergen Reactions   Sulfa Antibiotics Rash      Review of Systems: Constitutional:  No  fever, no chills, No recent illness, No unintentional weight changes. No significant fatigue.  HEENT: No  headache, no vision change, no hearing change, No sore throat, No  sinus pressure Cardiac: No  chest pain, No  pressure, No palpitations, No  Orthopnea Respiratory:  No  shortness of breath. No  Cough Gastrointestinal: No  abdominal pain, No  nausea, No  vomiting,  No  blood in stool, No  diarrhea, No  constipation  Musculoskeletal: No new myalgia/arthralgia Skin: No  Rash, No other wounds/concerning lesions Genitourinary: No  incontinence, No  abnormal genital bleeding, No abnormal genital discharge Hem/Onc: No  easy bruising/bleeding, No  abnormal lymph node Endocrine: No cold intolerance,  No heat intolerance. No polyuria/polydipsia/polyphagia  Neurologic: No  weakness, No  dizziness, No  slurred speech/focal weakness/facial droop Psychiatric: No  concerns with depression, No  concerns with anxiety, No sleep problems, No mood problems  Exam:  BP 121/66 (BP Location: Right Arm, Patient Position: Sitting, Cuff Size: Normal)   Pulse 75   Resp 20   Ht 5' 6.5" (1.689 m)   Wt 147 lb 8 oz (66.9 kg)   SpO2 99%   BMI 23.45 kg/m  Constitutional: VS see above. General Appearance: alert, well-developed, well-nourished, NAD Eyes: Normal lids and conjunctive,  non-icteric sclera Ears, Nose, Mouth, Throat: MMM, Normal external inspection ears/nares/mouth/lips/gums. TM normal bilaterally.   Neck: No masses, trachea midline. No thyroid enlargement. No tenderness/mass appreciated. No lymphadenopathy Respiratory: Normal respiratory effort. no wheeze, no rhonchi, no rales Cardiovascular: S1/S2 normal, no murmur, no rub/gallop auscultated. RRR. No lower extremity edema. Pedal pulse II/IV bilaterally PT. No carotid bruit or JVD. No abdominal  aortic bruit. Gastrointestinal: Nontender, no masses. No hepatomegaly, no splenomegaly. No hernia appreciated. Bowel sounds normal. Rectal exam deferred.  Musculoskeletal: Gait normal. No clubbing/cyanosis of digits.  Neurological: Normal balance/coordination. No tremor. No cranial nerve deficit on limited exam. Motor and sensation intact and symmetric. Cerebellar reflexes intact.  Skin: warm, dry, intact. No rash/ulcer. No concerning nevi or subq nodules on limited exam.   Psychiatric: Normal judgment/insight. Normal mood and affect. Oriented x3.    ASSESSMENT/PLAN:   1. Annual physical exam Checking labs.  Wellness information provided with AVS.  Up-to-date on preventative care. - Lipid panel - COMPLETE METABOLIC PANEL WITH GFR - CBC with Differential/Platelet  2. Primary insomnia Discussed use of over-the-counter melatonin as well as herbal options such as chamomile, valerian root, and kava kava.   Orders Placed This Encounter  Procedures   Hemoglobin A1c   Microalbumin, urine   Lipid panel   COMPLETE METABOLIC PANEL WITH GFR   CBC with Differential/Platelet   HM DIABETES FOOT EXAM   HM COLONOSCOPY    No orders of the defined types were placed in this encounter.   Patient Instructions  Preventive Care 78-21 Years Old, Female Preventive care refers to lifestyle choices and visits with your health care provider that can promote health and wellness. Preventive care visits are also called wellness exams. What can I expect for my preventive care visit? Counseling Your health care provider may ask you questions about your: Medical history, including: Past medical problems. Family medical history. Pregnancy history. Current health, including: Menstrual cycle. Method of birth control. Emotional well-being. Home life and relationship well-being. Sexual activity and sexual health. Lifestyle, including: Alcohol, nicotine or tobacco, and drug use. Access to firearms. Diet,  exercise, and sleep habits. Work and work Statistician. Sunscreen use. Safety issues such as seatbelt and bike helmet use. Physical exam Your health care provider will check your: Height and weight. These may be used to calculate your BMI (body mass index). BMI is a measurement that tells if you are at a healthy weight. Waist circumference. This measures the distance around your waistline. This measurement also tells if you are at a healthy weight and may help predict your risk of certain diseases, such as type 2 diabetes and high blood pressure. Heart rate and blood pressure. Body temperature. Skin for abnormal spots. What immunizations do I need? Vaccines are usually given at various ages, according to a schedule. Your health care provider will recommend vaccines for you based on your age, medical history, and lifestyle or other factors, such as travel or where you work. What tests do I need? Screening Your health care provider may recommend screening tests for certain conditions. This may include: Lipid and cholesterol levels. Diabetes screening. This is done by checking your blood sugar (glucose) after you have not eaten for a while (fasting). Pelvic exam and Pap test. Hepatitis B test. Hepatitis C test. HIV (human immunodeficiency virus) test. STI (sexually transmitted infection) testing, if you are at risk. Lung cancer screening. Colorectal cancer screening. Mammogram. Talk with your health care provider about when you should start having regular mammograms. This may depend  on whether you have a family history of breast cancer. BRCA-related cancer screening. This may be done if you have a family history of breast, ovarian, tubal, or peritoneal cancers. Bone density scan. This is done to screen for osteoporosis. Talk with your health care provider about your test results, treatment options, and if necessary, the need for more tests. Follow these instructions at home: Eating and  drinking  Eat a diet that includes fresh fruits and vegetables, whole grains, lean protein, and low-fat dairy products. Take vitamin and mineral supplements as recommended by your health care provider. Do not drink alcohol if: Your health care provider tells you not to drink. You are pregnant, may be pregnant, or are planning to become pregnant. If you drink alcohol: Limit how much you have to 0-1 drink a day. Know how much alcohol is in your drink. In the [place] one drink equals one 12 oz bottle of beer (355 mL), one 5 oz glass of wine (148 mL), or one 1 oz glass of hard liquor (44 mL). Lifestyle Brush your teeth every morning and night with fluoride toothpaste. Floss one time each day. Exercise for at least 30 minutes 5 or more days each week. Do not use any products that contain nicotine or tobacco. These products include cigarettes, chewing tobacco, and vaping devices, such as e-cigarettes. If you need help quitting, ask your health care provider. Do not use drugs. If you are sexually active, practice safe sex. Use a condom or other form of protection to prevent STIs. If you do not wish to become pregnant, use a form of birth control. If you plan to become pregnant, see your health care provider for a prepregnancy visit. Take aspirin only as told by your health care provider. Make sure that you understand how much to take and what form to take. Work with your health care provider to find out whether it is safe and beneficial for you to take aspirin daily. Find healthy ways to manage stress, such as: Meditation, yoga, or listening to music. Journaling. Talking to a trusted person. Spending time with friends and family. Minimize exposure to UV radiation to reduce your risk of skin cancer. Safety Always wear your seat belt while driving or riding in a vehicle. Do not drive: If you have been drinking alcohol. Do not ride with someone who has been drinking. When you are tired or  distracted. While texting. If you have been using any mind-altering substances or drugs. Wear a helmet and other protective equipment during sports activities. If you have firearms in your house, make sure you follow all gun safety procedures. Seek help if you have been physically or sexually abused. What's next? Visit your health care provider once a year for an annual wellness visit. Ask your health care provider how often you should have your eyes and teeth checked. Stay up to date on all vaccines. This information is not intended to replace advice given to you by your health care provider. Make sure you discuss any questions you have with your health care provider. Document Revised: 12/07/2020 Document Reviewed: 12/07/2020 Elsevier Patient Education  [place].  Follow-up plan: Return in about 1 year (around 05/12/2022) for annual physical exam or sooner if needed.  Clearnce Sorrel, DNP, APRN, FNP-BC [facility]

## 2021-05-24 DIAGNOSIS — Z794 Long term (current) use of insulin: Secondary | ICD-10-CM | POA: Diagnosis not present

## 2021-05-24 DIAGNOSIS — E109 Type 1 diabetes mellitus without complications: Secondary | ICD-10-CM | POA: Diagnosis not present

## 2021-06-14 DIAGNOSIS — Z0000 Encounter for general adult medical examination without abnormal findings: Secondary | ICD-10-CM | POA: Diagnosis not present

## 2021-06-14 LAB — CBC WITH DIFFERENTIAL/PLATELET
Absolute Monocytes: 601 cells/uL (ref 200–950)
Basophils Absolute: 68 cells/uL (ref 0–200)
Basophils Relative: 1.1 %
Eosinophils Absolute: 211 cells/uL (ref 15–500)
Eosinophils Relative: 3.4 %
HCT: 44.5 % (ref 35.0–45.0)
Hemoglobin: 14.9 g/dL (ref 11.7–15.5)
Lymphs Abs: 2145 cells/uL (ref 850–3900)
MCH: 31 pg (ref 27.0–33.0)
MCHC: 33.5 g/dL (ref 32.0–36.0)
MCV: 92.5 fL (ref 80.0–100.0)
MPV: 11.8 fL (ref 7.5–12.5)
Monocytes Relative: 9.7 %
Neutro Abs: 3174 cells/uL (ref 1500–7800)
Neutrophils Relative %: 51.2 %
Platelets: 210 10*3/uL (ref 140–400)
RBC: 4.81 10*6/uL (ref 3.80–5.10)
RDW: 11.9 % (ref 11.0–15.0)
Total Lymphocyte: 34.6 %
WBC: 6.2 10*3/uL (ref 3.8–10.8)

## 2021-06-14 LAB — COMPLETE METABOLIC PANEL WITH GFR
AG Ratio: 1.6 (calc) (ref 1.0–2.5)
ALT: 9 U/L (ref 6–29)
AST: 12 U/L (ref 10–35)
Albumin: 4.1 g/dL (ref 3.6–5.1)
Alkaline phosphatase (APISO): 100 U/L (ref 37–153)
BUN: 19 mg/dL (ref 7–25)
CO2: 29 mmol/L (ref 20–32)
Calcium: 9.7 mg/dL (ref 8.6–10.4)
Chloride: 104 mmol/L (ref 98–110)
Creat: 0.95 mg/dL (ref 0.50–1.05)
Globulin: 2.5 g/dL (calc) (ref 1.9–3.7)
Glucose, Bld: 125 mg/dL — ABNORMAL HIGH (ref 65–99)
Potassium: 4.4 mmol/L (ref 3.5–5.3)
Sodium: 139 mmol/L (ref 135–146)
Total Bilirubin: 0.6 mg/dL (ref 0.2–1.2)
Total Protein: 6.6 g/dL (ref 6.1–8.1)
eGFR: 67 mL/min/{1.73_m2} (ref >=60)

## 2021-06-14 LAB — LIPID PANEL
Cholesterol: 202 mg/dL — ABNORMAL HIGH (ref <200)
HDL: 81 mg/dL (ref >=50)
LDL Cholesterol (Calc): 107 mg/dL (calc) — ABNORMAL HIGH
Non-HDL Cholesterol (Calc): 121 mg/dL (calc) (ref <130)
Total CHOL/HDL Ratio: 2.5 (calc) (ref <5.0)
Triglycerides: 49 mg/dL (ref <150)

## 2021-06-15 ENCOUNTER — Other Ambulatory Visit: Payer: Self-pay | Admitting: Medical-Surgical

## 2021-06-15 DIAGNOSIS — E785 Hyperlipidemia, unspecified: Secondary | ICD-10-CM

## 2021-06-15 MED ORDER — ATORVASTATIN CALCIUM 20 MG PO TABS: 20.0000 mg | ORAL_TABLET | Freq: Every day | ORAL | 3 refills | Status: DC

## 2021-06-28 DIAGNOSIS — Z794 Long term (current) use of insulin: Secondary | ICD-10-CM | POA: Diagnosis not present

## 2021-06-28 DIAGNOSIS — E109 Type 1 diabetes mellitus without complications: Secondary | ICD-10-CM | POA: Diagnosis not present

## 2021-07-06 DIAGNOSIS — Z79890 Hormone replacement therapy (postmenopausal): Secondary | ICD-10-CM | POA: Diagnosis not present

## 2021-07-06 DIAGNOSIS — E039 Hypothyroidism, unspecified: Secondary | ICD-10-CM | POA: Diagnosis not present

## 2021-07-06 DIAGNOSIS — Z9641 Presence of insulin pump (external) (internal): Secondary | ICD-10-CM | POA: Diagnosis not present

## 2021-07-06 DIAGNOSIS — Z794 Long term (current) use of insulin: Secondary | ICD-10-CM | POA: Diagnosis not present

## 2021-07-06 DIAGNOSIS — E1065 Type 1 diabetes mellitus with hyperglycemia: Secondary | ICD-10-CM | POA: Diagnosis not present

## 2021-07-06 DIAGNOSIS — E109 Type 1 diabetes mellitus without complications: Secondary | ICD-10-CM | POA: Diagnosis not present

## 2021-07-06 DIAGNOSIS — Z79899 Other long term (current) drug therapy: Secondary | ICD-10-CM | POA: Diagnosis not present

## 2021-07-10 ENCOUNTER — Encounter: Payer: Self-pay | Admitting: Medical-Surgical

## 2021-07-11 DIAGNOSIS — E109 Type 1 diabetes mellitus without complications: Secondary | ICD-10-CM | POA: Diagnosis not present

## 2021-07-11 DIAGNOSIS — Z794 Long term (current) use of insulin: Secondary | ICD-10-CM | POA: Diagnosis not present

## 2021-07-31 DIAGNOSIS — E109 Type 1 diabetes mellitus without complications: Secondary | ICD-10-CM | POA: Diagnosis not present

## 2021-07-31 DIAGNOSIS — Z794 Long term (current) use of insulin: Secondary | ICD-10-CM | POA: Diagnosis not present

## 2021-08-08 ENCOUNTER — Encounter: Payer: Self-pay | Admitting: Medical-Surgical

## 2021-08-08 ENCOUNTER — Telehealth: Payer: BC Managed Care – PPO | Admitting: Family Medicine

## 2021-08-08 DIAGNOSIS — E1069 Type 1 diabetes mellitus with other specified complication: Secondary | ICD-10-CM | POA: Diagnosis not present

## 2021-08-08 DIAGNOSIS — U071 COVID-19: Secondary | ICD-10-CM | POA: Diagnosis not present

## 2021-08-08 MED ORDER — NIRMATRELVIR/RITONAVIR (PAXLOVID)TABLET: 3.0000 | ORAL_TABLET | Freq: Two times a day (BID) | ORAL | 0 refills | Status: AC

## 2021-08-08 NOTE — Patient Instructions (Addendum)
Please keep well-hydrated and get plenty of rest. Start a saline nasal rinse to flush out your nasal passages. You can use plain Mucinex to help thin congestion. If you have a humidifier, running in the bedroom at night. I want you to start OTC vitamin D3 1000 units daily, vitamin C 1000 mg daily, and a zinc supplement. Please take prescribed medications as directed.  You have been enrolled in a MyChart symptom monitoring program. Please answer these questions daily so we can keep track of how you are doing.  You were to quarantine for 5 days from onset of your symptoms.  After day 5, if you have had no fever and you are feeling better, you can end quarantine but need to mask for an additional 5 days. After day 5 if you have a fever or are having significant symptoms, please quarantine for full 10 days.  If you note any worsening of symptoms, any significant shortness of breath or any chest pain, please seek ER evaluation ASAP.  Please do not delay care!  COVID-19: What to Do if You Are Sick If you test positive and are an older adult or someone who is at high risk of getting very sick from COVID-19, treatment may be available. Contact a healthcare provider right away after a positive test to determine if you are eligible, even if your symptoms are mild right now. You can also visit a Test to Treat location and, if eligible, receive a prescription from a provider. Don't delay: Treatment must be started within the first few days to be effective. If you have a fever, cough, or other symptoms, you might have COVID-19. Most people have mild illness and are able to recover at home. If you are sick: Keep track of your symptoms. If you have an emergency warning sign (including trouble breathing), call 911. Steps to help prevent the spread of COVID-19 if you are sick If you are sick with COVID-19 or think you might have COVID-19, follow the steps below to care for yourself and to help protect other  people in your home and community. Stay home except to get medical care Stay home. Most people with COVID-19 have mild illness and can recover at home without medical care. Do not leave your home, except to get medical care. Do not visit public areas and do not go to places where you are unable to wear a mask. Take care of yourself. Get rest and stay hydrated. Take over-the-counter medicines, such as acetaminophen, to help you feel better. Stay in touch with your doctor. Call before you get medical care. Be sure to get care if you have trouble breathing, or have any other emergency warning signs, or if you think it is an emergency. Avoid public transportation, ride-sharing, or taxis if possible. Get tested If you have symptoms of COVID-19, get tested. While waiting for test results, stay away from others, including staying apart from those living in your household. Get tested as soon as possible after your symptoms start. Treatments may be available for people with COVID-19 who are at risk for becoming very sick. Don't delay: Treatment must be started early to be effective--some treatments must begin within 5 days of your first symptoms. Contact your healthcare provider right away if your test result is positive to determine if you are eligible. Self-tests are one of several options for testing for the virus that causes COVID-19 and may be more convenient than laboratory-based tests and point-of-care tests. Ask your healthcare provider or   your local health department if you need help interpreting your test results. You can visit your state, tribal, local, and territorial health department's website to look for the latest local information on testing sites. Separate yourself from other people As much as possible, stay in a specific room and away from other people and pets in your home. If possible, you should use a separate bathroom. If you need to be around other people or animals in or outside of the  home, wear a well-fitting mask. Tell your close contacts that they may have been exposed to COVID-19. An infected person can spread COVID-19 starting 48 hours (or 2 days) before the person has any symptoms or tests positive. By letting your close contacts know they may have been exposed to COVID-19, you are helping to protect everyone. See COVID-19 and Animals if you have questions about pets. If you are diagnosed with COVID-19, someone from the health department may call you. Answer the call to slow the spread. Monitor your symptoms Symptoms of COVID-19 include fever, cough, or other symptoms. Follow care instructions from your healthcare provider and local health department. Your local health authorities may give instructions on checking your symptoms and reporting information. When to seek emergency medical attention Look for emergency warning signs* for COVID-19. If someone is showing any of these signs, seek emergency medical care immediately: Trouble breathing Persistent pain or pressure in the chest New confusion Inability to wake or stay awake Pale, gray, or blue-colored skin, lips, or nail beds, depending on skin tone *This list is not all possible symptoms. Please call your medical provider for any other symptoms that are severe or concerning to you. Call 911 or call ahead to your local emergency facility: Notify the operator that you are seeking care for someone who has or may have COVID-19. Call ahead before visiting your doctor Call ahead. Many medical visits for routine care are being postponed or done by phone or telemedicine. If you have a medical appointment that cannot be postponed, call your doctor's office, and tell them you have or may have COVID-19. This will help the office protect themselves and other patients. If you are sick, wear a well-fitting mask You should wear a mask if you must be around other people or animals, including pets (even at home). Wear a mask with the  best fit, protection, and comfort for you. You don't need to wear the mask if you are alone. If you can't put on a mask (because of trouble breathing, for example), cover your coughs and sneezes in some other way. Try to stay at least 6 feet away from other people. This will help protect the people around you. Masks should not be placed on young children under age 2 years, anyone who has trouble breathing, or anyone who is not able to remove the mask without help. Cover your coughs and sneezes Cover your mouth and nose with a tissue when you cough or sneeze. Throw away used tissues in a lined trash can. Immediately wash your hands with soap and water for at least 20 seconds. If soap and water are not available, clean your hands with an alcohol-based hand sanitizer that contains at least 60% alcohol. Clean your hands often Wash your hands often with soap and water for at least 20 seconds. This is especially important after blowing your nose, coughing, or sneezing; going to the bathroom; and before eating or preparing food. Use hand sanitizer if soap and water are not available. Use an   alcohol-based hand sanitizer with at least 60% alcohol, covering all surfaces of your hands and rubbing them together until they feel dry. Soap and water are the best option, especially if hands are visibly dirty. Avoid touching your eyes, nose, and mouth with unwashed hands. Handwashing Tips Avoid sharing personal household items Do not share dishes, drinking glasses, cups, eating utensils, towels, or bedding with other people in your home. Wash these items thoroughly after using them with soap and water or put in the dishwasher. Clean surfaces in your home regularly Clean and disinfect high-touch surfaces (for example, doorknobs, tables, handles, light switches, and countertops) in your "sick room" and bathroom. In shared spaces, you should clean and disinfect surfaces and items after each use by the person who is  ill. If you are sick and cannot clean, a caregiver or other person should only clean and disinfect the area around you (such as your bedroom and bathroom) on an as needed basis. Your caregiver/other person should wait as long as possible (at least several hours) and wear a mask before entering, cleaning, and disinfecting shared spaces that you use. Clean and disinfect areas that may have blood, stool, or body fluids on them. Use household cleaners and disinfectants. Clean visible dirty surfaces with household cleaners containing soap or detergent. Then, use a household disinfectant. Use a product from [employer] List N: Disinfectants for Coronavirus ([id number]). Be sure to follow the instructions on the label to ensure safe and effective use of the product. Many products recommend keeping the surface wet with a disinfectant for a certain period of time (look at "contact time" on the product label). You may also need to wear personal protective equipment, such as gloves, depending on the directions on the product label. Immediately after disinfecting, wash your hands with soap and water for 20 seconds. For completed guidance on cleaning and disinfecting your home, visit Complete Disinfection Guidance. Take steps to improve ventilation at home Improve ventilation (air flow) at home to help prevent from spreading COVID-19 to other people in your household. Clear out COVID-19 virus particles in the air by opening windows, using air filters, and turning on fans in your home. Use this interactive tool to learn how to improve air flow in your home. When you can be around others after being sick with COVID-19 Deciding when you can be around others is different for different situations. Find out when you can safely end home isolation. For any additional questions about your care, contact your healthcare provider or state or local health department. 09/13/2020 Content source: [facility] for Immunization and  Respiratory Diseases ([facility]), Division of Viral Diseases This information is not intended to replace advice given to you by your health care provider. Make sure you discuss any questions you have with your health care provider. Document Revised: 10/27/2020 Document Reviewed: 10/27/2020 Elsevier Patient Education  2022 [employer]; Ritonavir Tablets What is this medication? NIRMATRELVIR; RITONAVIR (NIR ma TREL vir; ri TOE na veer) treats mild to moderate COVID-19. It may help people who are at high risk of developing severe illness. This medication works by limiting the spread of the virus in your body. The FDA has allowed the emergency use of this medication. This medicine may be used for other purposes; ask your health care provider or pharmacist if you have questions. COMMON BRAND NAME(S): PAXLOVID What should I tell my care team before I take this medication? They need to know if you have any of these  conditions: Any allergies Any serious illness Kidney disease Liver disease An unusual or allergic reaction to nirmatrelvir, ritonavir, other medications, foods, dyes, or preservatives Pregnant or trying to get pregnant Breast-feeding How should I use this medication? This product contains 2 different medications that are packaged together. For the standard dose, take 2 pink tablets of nirmatrelvir with 1 white tablet of ritonavir (3 tablets total) by mouth with water twice daily. Talk to your care team if you have kidney disease. You may need a different dose. Swallow the tablets whole. You can take it with or without food. If it upsets your stomach, take it with food. Take all of this medication unless your care team tells you to stop it early. Keep taking it even if you think you are better. Talk to your care team about the use of this medication in children. While it may be prescribed for children as young as 12 years for selected conditions, precautions do  apply. Overdosage: If you think you have taken too much of this medicine contact a poison control center or emergency room at once. NOTE: This medicine is only for you. Do not share this medicine with others. What if I miss a dose? If you miss a dose, take it as soon as you can unless it is more than 8 hours late. If it is more than 8 hours late, skip the missed dose. Take the next dose at the normal time. Do not take extra or 2 doses at the same time to make up for the missed dose. What may interact with this medication? Do not take this medication with any of the following medications: Alfuzosin Certain medications for anxiety or sleep like midazolam, triazolam Certain medications for cancer like apalutamide, enzalutamide Certain medications for cholesterol like lovastatin, simvastatin Certain medications for irregular heart beat like amiodarone, dronedarone, flecainide, propafenone, quinidine Certain medications for pain like meperidine, piroxicam Certain medications for psychotic disorders like clozapine, lurasidone, pimozide Certain medications for seizures like carbamazepine, phenobarbital, phenytoin Colchicine Eletriptan Eplerenone Ergot alkaloids like dihydroergotamine, ergonovine, ergotamine, methylergonovine Finerenone Flibanserin Ivabradine Lomitapide Naloxegol Ranolazine Rifampin Sildenafil Silodosin [facility]. John's Wort Tolvaptan Ubrogepant Voclosporin This medication may also interact with the following medications: Bedaquiline Birth control pills Bosentan Certain antibiotics like erythromycin or clarithromycin Certain medications for blood pressure like amlodipine, diltiazem, felodipine, nicardipine, nifedipine Certain medications for cancer like abemaciclib, ceritinib, dasatinib, encorafenib, ibrutinib, ivosidenib, neratinib, nilotinib, venetoclax, vinblastine, vincristine Certain medications for cholesterol like atorvastatin, rosuvastatin Certain medications for  depression like bupropion, trazodone Certain medications for fungal infections like isavuconazonium, itraconazole, ketoconazole, voriconazole Certain medications for hepatitis C like elbasvir; grazoprevir, dasabuvir; ombitasvir; paritaprevir; ritonavir, glecaprevir; pibrentasvir, sofosbuvir; velpatasvir; voxilaprevir Certain medications for HIV or AIDS Certain medications for irregular heartbeat like lidocaine Certain medications that treat or prevent blood clots like rivaroxaban, warfarin Digoxin Fentanyl Medications that lower your chance of fighting infection like cyclosporine, sirolimus, tacrolimus Methadone Quetiapine Rifabutin Salmeterol Steroid medications like betamethasone, budesonide, ciclesonide, dexamethasone, fluticasone, methylprednisone, mometasone, triamcinolone This list may not describe all possible interactions. Give your health care provider a list of all the medicines, herbs, non-prescription drugs, or dietary supplements you use. Also tell them if you smoke, drink alcohol, or use illegal drugs. Some items may interact with your medicine. What should I watch for while using this medication? Your condition will be monitored carefully while you are receiving this medication. Visit your care team for regular checkups. Tell your care team if your symptoms do not start to get better or if they  get worse. If you have untreated HIV infection, this medication may lead to some HIV medications not working as well in the future. Birth control may not work properly while you are taking this medication. Talk to your care team about using an extra method of birth control. What side effects may I notice from receiving this medication? Side effects that you should report to your care team as soon as possible: Allergic reactions--skin rash, itching, hives, swelling of the face, lips, tongue, or throat Liver injury--right upper belly pain, loss of appetite, nausea, light-colored stool, dark  yellow or brown urine, yellowing skin or eyes, unusual weakness or fatigue Redness, blistering, peeling, or loosening of the skin, including inside the mouth Side effects that usually do not require medical attention (report these to your care team if they continue or are bothersome): Change in taste Diarrhea General discomfort and fatigue Increase in blood pressure Muscle pain Nausea Stomach pain This list may not describe all possible side effects. Call your doctor for medical advice about side effects. You may report side effects to FDA at 1-800-FDA-1088. Where should I keep my medication? Keep out of the reach of children and pets. Store at room temperature between 20 and 25 degrees C (68 and 77 degrees F). Get rid of any unused medication after the expiration date. To get rid of medications that are no longer needed or have expired: Take the medication to a medication take-back program. Check with your pharmacy or law enforcement to find a location. If you cannot return the medication, check the label or package insert to see if the medication should be thrown out in the garbage or flushed down the toilet. If you are not sure, ask your care team. If it is safe to put it in the trash, take the medication out of the container. Mix the medication with cat litter, dirt, coffee grounds, or other unwanted substance. Seal the mixture in a bag or container. Put it in the trash. NOTE: This sheet is a summary. It may not cover all possible information. If you have questions about this medicine, talk to your doctor, pharmacist, or health care provider.  2022 Elsevier/Gold Standard (2021-03-13 00:00:00)

## 2021-08-08 NOTE — Progress Notes (Signed)
Virtual Visit Consent   Tanya Schmidt, you are scheduled for a virtual visit with a [facility] provider today.     Just as with appointments in the office, your consent must be obtained to participate.  Your consent will be active for this visit and any virtual visit you may have with one of our providers in the next 365 days.     If you have a MyChart account, a copy of this consent can be sent to you electronically.  All virtual visits are billed to your insurance company just like a traditional visit in the office.    As this is a virtual visit, video technology does not allow for your provider to perform a traditional examination.  This may limit your provider's ability to fully assess your condition.  If your provider identifies any concerns that need to be evaluated in person or the need to arrange testing (such as labs, EKG, etc.), we will make arrangements to do so.     Although advances in technology are sophisticated, we cannot ensure that it will always work on either your end or our end.  If the connection with a video visit is poor, the visit may have to be switched to a telephone visit.  With either a video or telephone visit, we are not always able to ensure that we have a secure connection.     I need to obtain your verbal consent now.   Are you willing to proceed with your visit today?    Sumner Greeley has provided verbal consent on 08/08/2021 for a virtual visit (video or telephone).   Perlie Mayo, NP   Date: 08/08/2021 3:14 PM   Virtual Visit via Video Note   I, Perlie Mayo, connected with  Tanya Schmidt  ([id number], February 22, 1956) on 08/08/21 at  3:15 PM EST by a video-enabled telemedicine application and verified that I am speaking with the correct person using two identifiers.  Location: Patient: Virtual Visit Location Patient: Home Provider: Virtual Visit Location Provider: Home Office   I discussed the limitations of evaluation and management by  telemedicine and the availability of in person appointments. The patient expressed understanding and agreed to proceed.    History of Present Illness: Tanya Schmidt is a 66 y.o. who identifies as a female who was assigned female at birth, and is being seen today for covid + Onset of symptoms Sunday were fatigue, achy, sneezing, cough, headache, runny nose. Reports some mild chest tightness- that has subsided.  Denies chest pain, shortness of breath, ear pain  Insulin dependent DM on a insulin pump.  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    Allergies:  Allergies  Allergen Reactions   Sulfa Antibiotics Rash   Medications:  Current Outpatient Medications:    atorvastatin (LIPITOR) 20 MG tablet, Take 1 tablet (20 mg total) by mouth daily., Disp: 90 tablet, Rfl: 3   cetirizine (ZYRTEC) 10 MG tablet, Take 10 mg by mouth daily., Disp: , Rfl:    glucagon 1 MG injection, Used as directed for severe hypoglycemia, Disp: , Rfl:    Insulin Infusion Pump (DEXCOM G6 CONTROL-IQ INS PUMP) DEVI, by Does not apply route., Disp: , Rfl:  Insulin Lispro (HUMALOG IJ), 70 Units daily., Disp: , Rfl:    Lancets (ONETOUCH ULTRASOFT) lancets, Testing 4 times daily, Disp: , Rfl:    levothyroxine (SYNTHROID) 75 MCG tablet, TAKE 1 TABLET BY MOUTH DAILY, Disp: , Rfl:   Observations/Objective: Patient is well-developed, well-nourished in no acute distress.  Resting comfortably  at home.  Head is normocephalic, atraumatic.  No labored breathing.  Speech is clear and coherent with logical content.  Patient is alert and oriented at baseline.    Assessment and Plan:  1. COVID-19 COVID + on HT DM type 1  Will start paxloid  No red flags at this time.  2. Type 1  diabetes mellitus with other specified complication ([place])  Educated on DM care during illness- very aware and strong historian of her care.   Reviewed side effects, risks and benefits of medication.    Patient acknowledged agreement and understanding of the plan.   I discussed the assessment and treatment plan with the patient. The patient was provided an opportunity to ask questions and all were answered. The patient agreed with the plan and demonstrated an understanding of the instructions.   The patient was advised to call back or seek an in-person evaluation if the symptoms worsen or if the condition fails to improve as anticipated.   The above assessment and management plan was discussed with the patient. The patient verbalized understanding of and has agreed to the management plan. Patient is aware to call the clinic if symptoms persist or worsen. Patient is aware when to return to the clinic for a follow-up visit. Patient educated on when it is appropriate to go to the emergency department.     Follow Up Instructions: I discussed the assessment and treatment plan with the patient. The patient was provided an opportunity to ask questions and all were answered. The patient agreed with the plan and demonstrated an understanding of the instructions.  A copy of instructions were sent to the patient via MyChart unless otherwise noted below.     The patient was advised to call back or seek an in-person evaluation if the symptoms worsen or if the condition fails to improve as anticipated.  Time:  I spent 10 minutes with the patient via telehealth technology discussing the above problems/concerns.    Perlie Mayo, NP

## 2021-08-18 NOTE — Progress Notes (Signed)
Last Mammogram: 04/20/21- normal Last Pap Smear:  03/28/20- negative Last Colon Screening;  Due now Seat Belts:   Yes Sun Screen:   Yes Dental Check Up:  Yes Brush & Floss:  Yes   Subjective:     Tanya Schmidt is a 66 y.o. female here for a routine exam.  Current complaints: Unable to have intercourse due to pain at introitus.  Patient has not taken clobetasol because she felt it did not help.  She thinks the lichen sclerosus is gotten worse.  Her interstitial cystitis symptoms are better..    Gynecologic History No LMP recorded. Patient is postmenopausal. Contraception: post menopausal status Last Mammogram: 04/20/21- normal Last Pap Smear:  03/28/20- negative Last Colon Screening;  Due now  Obstetric History OB History  Gravida Para Term Preterm AB Living  3 2 2     2   SAB IAB Ectopic Multiple Live Births               # Outcome Date GA Lbr Len/2nd Weight Sex Delivery Anes PTL Lv  3 Gravida           2 Term    11 lb 3 oz (5.075 kg)  CS-Unspec     1 Term    8 lb 3 oz (3.714 kg)  CS-Unspec        The following portions of the patient's history were reviewed and updated as appropriate: allergies, current medications, past family history, past medical history, past social history, past surgical history, and problem list.  Review of Systems Pertinent items noted in HPI and remainder of comprehensive ROS otherwise negative.    Objective:   Vitals:   08/21/21 1524  BP: 125/68  Pulse: 78  Weight: 145 lb (65.8 kg)  Height: 5\' 7"  (1.702 m)   Vitals:  WNL General appearance: alert, cooperative and no distress  HEENT: Normocephalic, without obvious abnormality, atraumatic Eyes: negative Throat: lips, mucosa, and tongue normal; teeth and gums normal  Respiratory: Clear to auscultation bilaterally  CV: Regular rate and rhythm  Breasts:  Normal appearance, no masses or tenderness, no nipple retraction or dimpling  GI: Soft, non-tender; bowel sounds normal; no masses,  no  organomegaly  GU: External Genitalia:  Tanner V, no lesion Urethra:  No prolapse   Vagina: Pink, normal rugae, no blood or discharge  Cervix: No CMT, no lesion  Uterus:  Normal size and contour, non tender  Adnexa: Normal, no masses, non tender  Musculoskeletal: No edema, redness or tenderness in the calves or thighs  Skin: No lesions or rash  Lymphatic: Axillary adenopathy: none     Psychiatric: Normal mood and behavior       Assessment:    Healthy female exam.    Plan:    1.  Mammogram up to date 2.  Pap smear up to date 3.  Colonoscopy due 4.  Given that clobetasol has not worked well for patient in the past, we will try tacrolimus.  She is aware of the black box warning that there could be risk of skin cancer or lymphoma.  She will put this on twice a day for 6 weeks and follow-up.  If the plaque is still there near her anus, we will do a biopsy of this area.

## 2021-08-21 ENCOUNTER — Encounter: Payer: Self-pay | Admitting: Obstetrics & Gynecology

## 2021-08-21 ENCOUNTER — Other Ambulatory Visit: Payer: Self-pay

## 2021-08-21 ENCOUNTER — Ambulatory Visit (INDEPENDENT_AMBULATORY_CARE_PROVIDER_SITE_OTHER): Payer: BC Managed Care – PPO | Admitting: Obstetrics & Gynecology

## 2021-08-21 VITALS — BP 125/68 | HR 78 | Ht 67.0 in | Wt 145.0 lb

## 2021-08-21 DIAGNOSIS — Z01419 Encounter for gynecological examination (general) (routine) without abnormal findings: Secondary | ICD-10-CM

## 2021-08-21 DIAGNOSIS — L900 Lichen sclerosus et atrophicus: Secondary | ICD-10-CM

## 2021-08-21 MED ORDER — TACROLIMUS 0.03 % EX OINT: TOPICAL_OINTMENT | Freq: Two times a day (BID) | CUTANEOUS | 1 refills | Status: DC

## 2021-08-21 MED ORDER — LIDOCAINE 5 % EX OINT: 1.0000 "application " | TOPICAL_OINTMENT | CUTANEOUS | 0 refills | Status: DC | PRN

## 2021-08-24 ENCOUNTER — Encounter: Payer: Self-pay | Admitting: Obstetrics & Gynecology

## 2021-08-25 ENCOUNTER — Encounter: Payer: Self-pay | Admitting: Medical-Surgical

## 2021-08-31 DIAGNOSIS — E785 Hyperlipidemia, unspecified: Secondary | ICD-10-CM | POA: Diagnosis not present

## 2021-09-01 LAB — HEPATIC FUNCTION PANEL
AG Ratio: 1.5 (calc) (ref 1.0–2.5)
ALT: 15 U/L (ref 6–29)
AST: 16 U/L (ref 10–35)
Albumin: 4.1 g/dL (ref 3.6–5.1)
Alkaline phosphatase (APISO): 94 U/L (ref 37–153)
Bilirubin, Direct: 0.1 mg/dL (ref 0.0–0.2)
Globulin: 2.8 g/dL (calc) (ref 1.9–3.7)
Indirect Bilirubin: 0.6 mg/dL (calc) (ref 0.2–1.2)
Total Bilirubin: 0.7 mg/dL (ref 0.2–1.2)
Total Protein: 6.9 g/dL (ref 6.1–8.1)

## 2021-09-01 LAB — LIPID PANEL
Cholesterol: 196 mg/dL (ref <200)
HDL: 87 mg/dL (ref >=50)
LDL Cholesterol (Calc): 95 mg/dL (calc)
Non-HDL Cholesterol (Calc): 109 mg/dL (calc) (ref <130)
Total CHOL/HDL Ratio: 2.3 (calc) (ref <5.0)
Triglycerides: 47 mg/dL (ref <150)

## 2021-09-26 DIAGNOSIS — E109 Type 1 diabetes mellitus without complications: Secondary | ICD-10-CM | POA: Diagnosis not present

## 2021-09-26 DIAGNOSIS — Z794 Long term (current) use of insulin: Secondary | ICD-10-CM | POA: Diagnosis not present

## 2021-10-09 ENCOUNTER — Other Ambulatory Visit (HOSPITAL_COMMUNITY)
Admission: RE | Admit: 2021-10-09 | Discharge: 2021-10-09 | Disposition: A | Discharge status: Home / Self Care | Payer: BC Managed Care – PPO | Source: Ambulatory Visit | Attending: Obstetrics & Gynecology | Admitting: Obstetrics & Gynecology

## 2021-10-09 ENCOUNTER — Encounter: Payer: Self-pay | Admitting: Obstetrics & Gynecology

## 2021-10-09 ENCOUNTER — Ambulatory Visit (INDEPENDENT_AMBULATORY_CARE_PROVIDER_SITE_OTHER): Payer: BC Managed Care – PPO | Admitting: Obstetrics & Gynecology

## 2021-10-09 VITALS — BP 151/67 | HR 83 | Ht 67.0 in | Wt 146.0 lb

## 2021-10-09 DIAGNOSIS — N9089 Other specified noninflammatory disorders of vulva and perineum: Secondary | ICD-10-CM | POA: Diagnosis not present

## 2021-10-09 DIAGNOSIS — L900 Lichen sclerosus et atrophicus: Secondary | ICD-10-CM

## 2021-10-09 NOTE — Progress Notes (Signed)
? ?  Subjective:  ? ? Patient ID: Tanya Schmidt, female    DOB: [birth year], 66 y.o.   MRN: [id number] ? ?HPI ? ?Tanya Schmidt is a 66 year old female who presents for follow-up of lichen sclerosus.  Patient had a decreased response to the clobetasol ointment.  At our last visit we changed her to tacrolimus.  She has been taking it twice a day almost religiously.  She feels like there lesion near her anus is rougher.  She also has some tearing during bowel movements.  Some days are better than others with pain.  She uses a lidocaine ointment periodically which helps. ? ?Review of Systems  ?Constitutional: Negative.   ?Respiratory: Negative.    ?Cardiovascular: Negative.   ?Gastrointestinal: Negative.   ?     A feeling of anal tearing with large bowel movements.  ?Genitourinary: Negative.   ?     Pain at perineum.  See picture.  ? ?   ?Objective:  ? Physical Exam ?Vitals reviewed.  ?Constitutional:   ?   General: She is not in acute distress. ?   Appearance: She is well-developed.  ?HENT:  ?   Head: Normocephalic and atraumatic.  ?Eyes:  ?   Conjunctiva/sclera: Conjunctivae normal.  ?Cardiovascular:  ?   Rate and Rhythm: Normal rate.  ?Pulmonary:  ?   Effort: Pulmonary effort is normal.  ?Skin: ?   General: Skin is warm and dry.  ?Neurological:  ?   Mental Status: She is alert and oriented to person, place, and time.  ?Psychiatric:     ?   Mood and Affect: Mood normal.  ? ? ? ?I compared pictures from this visit and last visit it does appear that the lesion is not responding to tacrolimus.  Given its increased roughness and continued pain, biopsy is indicated today. ? ?PERINEAL BIOPSY NOTE ? ?The indications for perineal biopsy (rule out neoplasia, confirm lichen sclerosus diagnosis) were reviewed.   Risks of the biopsy including pain, bleeding, infection, inadequate specimen, scarring and need for additional procedures  were discussed. The patient stated understanding and agreed to undergo procedure today. Consent was  signed,  time out performed.  ?The patient's vulva was prepped with Betadine. 1% lidocaine was injected into the perneum at the level of the lesion. A Kevorkian was used to biopsy was used to biopsy the lesion at 12 and 2 (clock based on anus).  Small bleeding was noted and hemostasis was achieved using direct pressure.  The patient tolerated the procedure well. The patient is to call with heavy bleeding, fever greater than 100.4, or severe pain. ?   ?Assessment & Plan:  ? ?66 yo female with lichen sclerosus not responding to therapy with photographic worsening of perianal lesion. ? ?2 biopsies as described above. ?We will base treatment on pathology diagnosis. ?

## 2021-10-11 ENCOUNTER — Telehealth: Payer: Self-pay | Admitting: *Deleted

## 2021-10-11 LAB — SURGICAL PATHOLOGY

## 2021-10-11 NOTE — Telephone Encounter (Signed)
Pt notified of pathology results and next steps per Dr Penne Lash.  Pt to contact her Endocrinologist to decide how to proceed with her insulin prior to steroid injection. ?

## 2021-10-23 ENCOUNTER — Ambulatory Visit (INDEPENDENT_AMBULATORY_CARE_PROVIDER_SITE_OTHER): Payer: BC Managed Care – PPO | Admitting: Obstetrics & Gynecology

## 2021-10-23 ENCOUNTER — Encounter: Payer: Self-pay | Admitting: Obstetrics & Gynecology

## 2021-10-23 VITALS — BP 144/69 | HR 68 | Resp 16 | Ht 67.0 in | Wt 145.0 lb

## 2021-10-23 DIAGNOSIS — R102 Pelvic and perineal pain: Secondary | ICD-10-CM

## 2021-10-23 NOTE — Progress Notes (Signed)
? ?  Subjective:  ? ? Patient ID: Tanya Schmidt, female    DOB: [birth year], 65 y.o.   MRN: [id number] ? ?HPI ? ?66 year old female with lichen sclerosus presented for an injection of triamcinolone into LS plaque.  Between our visits, she started on a low calcium oxalate diet and also started taking calcium citrate.  She continues to take the tacrolimus.  Patient feels that her symptoms are much improved and she is no longer having tearing with bowel movements.  She feels the plaque is thinner.  She would like to consider postponing the injection of triamcinolone. ? ?Review of Systems  ?Constitutional: Negative.   ?Respiratory: Negative.    ?Cardiovascular: Negative.   ?Gastrointestinal: Negative.   ?Genitourinary: Negative.   ? ?   ?Objective:  ? Physical Exam ? ? ?The plaque has decreased in thickness and there is no fissuring.  Patient does still demonstrate some vaginal tightness. ? ? ?   ?Assessment & Plan:  ? ?Lichen sclerosus improved with calcium oxalate free diet and calcium citrate.  Continue tacrolimus and current diet. ?Refer to physical therapy to help with introitus opening and vaginal wall tightness. ?Return to clinic in 3 months. ? ?I provided 30 minutes of verbal and non-verbal time during this encounter date, time was needed to gather information, review chart, records, communicate/coordinate with staff, as well as complete documentation. ? ?

## 2021-11-07 DIAGNOSIS — E785 Hyperlipidemia, unspecified: Secondary | ICD-10-CM | POA: Diagnosis not present

## 2021-11-07 DIAGNOSIS — E10618 Type 1 diabetes mellitus with other diabetic arthropathy: Secondary | ICD-10-CM | POA: Diagnosis not present

## 2021-11-07 DIAGNOSIS — E109 Type 1 diabetes mellitus without complications: Secondary | ICD-10-CM | POA: Diagnosis not present

## 2021-11-07 DIAGNOSIS — Z79899 Other long term (current) drug therapy: Secondary | ICD-10-CM | POA: Diagnosis not present

## 2021-11-07 DIAGNOSIS — M7500 Adhesive capsulitis of unspecified shoulder: Secondary | ICD-10-CM | POA: Diagnosis not present

## 2021-11-07 DIAGNOSIS — E10649 Type 1 diabetes mellitus with hypoglycemia without coma: Secondary | ICD-10-CM | POA: Diagnosis not present

## 2021-11-07 DIAGNOSIS — Z9641 Presence of insulin pump (external) (internal): Secondary | ICD-10-CM | POA: Diagnosis not present

## 2021-11-07 DIAGNOSIS — E039 Hypothyroidism, unspecified: Secondary | ICD-10-CM | POA: Diagnosis not present

## 2021-11-17 ENCOUNTER — Ambulatory Visit: Payer: BC Managed Care – PPO | Admitting: Physical Therapy

## 2021-12-26 DIAGNOSIS — E109 Type 1 diabetes mellitus without complications: Secondary | ICD-10-CM | POA: Diagnosis not present

## 2021-12-26 DIAGNOSIS — Z794 Long term (current) use of insulin: Secondary | ICD-10-CM | POA: Diagnosis not present

## 2022-03-07 ENCOUNTER — Other Ambulatory Visit: Payer: Self-pay | Admitting: Medical-Surgical

## 2022-03-07 DIAGNOSIS — Z1231 Encounter for screening mammogram for malignant neoplasm of breast: Secondary | ICD-10-CM

## 2022-03-15 DIAGNOSIS — Z794 Long term (current) use of insulin: Secondary | ICD-10-CM | POA: Diagnosis not present

## 2022-03-15 DIAGNOSIS — E109 Type 1 diabetes mellitus without complications: Secondary | ICD-10-CM | POA: Diagnosis not present

## 2022-04-03 DIAGNOSIS — Z978 Presence of other specified devices: Secondary | ICD-10-CM | POA: Diagnosis not present

## 2022-04-03 DIAGNOSIS — E039 Hypothyroidism, unspecified: Secondary | ICD-10-CM | POA: Diagnosis not present

## 2022-04-03 DIAGNOSIS — Z79899 Other long term (current) drug therapy: Secondary | ICD-10-CM | POA: Diagnosis not present

## 2022-04-03 DIAGNOSIS — E109 Type 1 diabetes mellitus without complications: Secondary | ICD-10-CM | POA: Diagnosis not present

## 2022-04-03 DIAGNOSIS — Z9641 Presence of insulin pump (external) (internal): Secondary | ICD-10-CM | POA: Diagnosis not present

## 2022-04-03 DIAGNOSIS — Z79890 Hormone replacement therapy (postmenopausal): Secondary | ICD-10-CM | POA: Diagnosis not present

## 2022-04-03 LAB — BASIC METABOLIC PANEL
BUN: 20 (ref 4–21)
CO2: 29 — AB (ref 13–22)
Chloride: 103 (ref 99–108)
Creatinine: 1.1 (ref 0.5–1.1)
Glucose: 100
Potassium: 4.2 mEq/L (ref 3.5–5.1)
Sodium: 140 (ref 137–147)

## 2022-04-03 LAB — COMPREHENSIVE METABOLIC PANEL
Calcium: 10.2 (ref 8.7–10.7)
eGFR: 54

## 2022-04-03 LAB — LIPID PANEL
Cholesterol: 168 (ref 0–200)
HDL: 85 — AB (ref 35–70)
LDL Cholesterol: 72
LDl/HDL Ratio: 2
Triglycerides: 59 (ref 40–160)

## 2022-04-03 LAB — CBC AND DIFFERENTIAL
HCT: 42 (ref 36–46)
Hemoglobin: 14.3 (ref 12.0–16.0)
Platelets: 193 10*3/uL (ref 150–400)
WBC: 6.5

## 2022-04-03 LAB — TSH: TSH: 1.6 (ref 0.41–5.90)

## 2022-04-03 LAB — CBC: RBC: 4.64 (ref 3.87–5.11)

## 2022-04-03 LAB — HEMOGLOBIN A1C: Hemoglobin A1C: 6.7

## 2022-04-26 ENCOUNTER — Encounter: Payer: Self-pay | Admitting: Medical-Surgical

## 2022-04-26 ENCOUNTER — Ambulatory Visit (INDEPENDENT_AMBULATORY_CARE_PROVIDER_SITE_OTHER): Payer: BC Managed Care – PPO

## 2022-04-26 ENCOUNTER — Ambulatory Visit (INDEPENDENT_AMBULATORY_CARE_PROVIDER_SITE_OTHER): Payer: BC Managed Care – PPO | Admitting: Medical-Surgical

## 2022-04-26 VITALS — BP 112/63 | HR 85 | Resp 20 | Ht 67.0 in | Wt 145.8 lb

## 2022-04-26 DIAGNOSIS — Z0000 Encounter for general adult medical examination without abnormal findings: Secondary | ICD-10-CM

## 2022-04-26 DIAGNOSIS — E785 Hyperlipidemia, unspecified: Secondary | ICD-10-CM

## 2022-04-26 DIAGNOSIS — Z23 Encounter for immunization: Secondary | ICD-10-CM | POA: Diagnosis not present

## 2022-04-26 DIAGNOSIS — Z1231 Encounter for screening mammogram for malignant neoplasm of breast: Secondary | ICD-10-CM

## 2022-04-26 MED ORDER — ATORVASTATIN CALCIUM 20 MG PO TABS: 20.0000 mg | ORAL_TABLET | Freq: Every day | ORAL | 3 refills | Status: DC

## 2022-04-26 NOTE — Progress Notes (Signed)
Complete physical exam  Patient: Tanya Schmidt   DOB: [birth year]   66 y.o. Female  MRN: [id number]  Subjective:    Chief Complaint  Patient presents with   Annual Exam    Tanya Schmidt is a 66 y.o. female who presents today for a complete physical exam. She reports consuming a general diet. Home exercise routine includes biking. She generally feels well. She reports sleeping well. She does not have additional problems to discuss today.    Most recent fall risk assessment:    05/12/2021    3:32 PM  [place] in the past year? 0  Number falls in past yr: 0  Injury with Fall? 0  Risk for fall due to : No Fall Risks  Follow up Falls evaluation completed     Most recent depression screenings:    05/12/2021    3:28 PM 05/09/2020    9:34 AM  PHQ 2/9 Scores  PHQ - 2 Score 0 2  PHQ- 9 Score  6    Vision:Within last year and Dental: No current dental problems and Receives regular dental care    Patient Care Team: Samuel Bouche, NP as PCP - General (Nurse Practitioner)   Outpatient Medications Prior to Visit  Medication Sig   cetirizine (ZYRTEC) 10 MG tablet Take 10 mg by mouth daily.   glucagon 1 MG injection Used as directed for severe hypoglycemia   Insulin Infusion Pump (DEXCOM G6 CONTROL-IQ INS PUMP) DEVI by Does not apply route.   Insulin Lispro (HUMALOG IJ) 70 Units daily.   Lancets (ONETOUCH ULTRASOFT) lancets Testing 4 times daily   levothyroxine (SYNTHROID) 75 MCG tablet TAKE 1 TABLET BY MOUTH DAILY   lidocaine (XYLOCAINE) 5 % ointment Apply 1 application topically as needed.   tacrolimus (PROTOPIC) 0.03 % ointment Apply topically 2 (two) times daily.   [DISCONTINUED] atorvastatin (LIPITOR) 20 MG tablet Take 1 tablet (20 mg total) by mouth daily.   No facility-administered medications prior to visit.    Review of Systems  Constitutional:  Negative for chills, fever, malaise/fatigue and weight loss.  HENT:  Negative for congestion, ear pain,  hearing loss, sinus pain and sore throat.   Eyes:  Negative for blurred vision, photophobia and pain.  Respiratory:  Negative for cough, shortness of breath and wheezing.   Cardiovascular:  Negative for chest pain, palpitations and leg swelling.  Gastrointestinal:  Negative for abdominal pain, constipation, diarrhea, heartburn, nausea and vomiting.  Genitourinary:  Negative for dysuria, frequency and urgency.  Musculoskeletal:  Negative for falls and neck pain.  Skin:  Negative for itching and rash.  Neurological:  Negative for dizziness, weakness and headaches.  Endo/Heme/Allergies:  Negative for polydipsia. Does not bruise/bleed easily.  Psychiatric/Behavioral:  Negative for depression, substance abuse and suicidal ideas. The patient is nervous/anxious (worry over job and husband's health).      Objective:    BP 112/63 (BP Location: Right Arm, Cuff Size: Normal)   Pulse 85   Resp 20   Ht _0  (1.702 m)   Wt 145 lb 12.8 oz (66.1 kg)   SpO2 97%   BMI 22.84 kg/m    Physical Exam Constitutional:      General: She is not in acute distress.    Appearance: Normal appearance. She is not ill-appearing.  HENT:     Head: Normocephalic and atraumatic.     Right Ear: Tympanic membrane, ear canal and external ear normal. There is no impacted cerumen.  Left Ear: Tympanic membrane, ear canal and external ear normal. There is no impacted cerumen.     Nose: Nose normal. No congestion or rhinorrhea.     Mouth/Throat:     Mouth: Mucous membranes are moist.     Pharynx: No oropharyngeal exudate or posterior oropharyngeal erythema.  Eyes:     General: No scleral icterus.       Right eye: No discharge.        Left eye: No discharge.     Extraocular Movements: Extraocular movements intact.     Conjunctiva/sclera: Conjunctivae normal.     Pupils: Pupils are equal, round, and reactive to light.  Neck:     Thyroid: No thyromegaly.     Vascular: No carotid bruit or JVD.     Trachea: Trachea  normal.  Cardiovascular:     Rate and Rhythm: Normal rate and regular rhythm.     Pulses: Normal pulses.     Heart sounds: Normal heart sounds. No murmur heard.    No friction rub. No gallop.  Pulmonary:     Effort: Pulmonary effort is normal. No respiratory distress.     Breath sounds: Normal breath sounds. No wheezing.  Abdominal:     General: Bowel sounds are normal. There is no distension.     Palpations: Abdomen is soft.     Tenderness: There is no abdominal tenderness. There is no guarding.  Musculoskeletal:        General: Normal range of motion.     Cervical back: Normal range of motion and neck supple.  Skin:    General: Skin is warm and dry.  Neurological:     Mental Status: She is alert and oriented to person, place, and time.     Cranial Nerves: No cranial nerve deficit.  Psychiatric:        Mood and Affect: Mood normal.        Behavior: Behavior normal.        Thought Content: Thought content normal.        Judgment: Judgment normal.    Results for orders placed or performed in visit on 04/26/22  CBC and differential  Result Value Ref Range   Hemoglobin 14.3 12.0 - 16.0   HCT 42 36 - 46   Platelets 193 150 - 400 K/uL   WBC 6.5   CBC  Result Value Ref Range   RBC 4.64 3.87 - 4.31  Basic metabolic panel  Result Value Ref Range   Glucose 100    BUN 20 4 - 21   CO2 29 (A) 13 - 22   Creatinine 1.1 0.5 - 1.1   Potassium 4.2 3.5 - 5.1 mEq/L   Sodium 140 137 - 147   Chloride 103 99 - 108  Comprehensive metabolic panel  Result Value Ref Range   eGFR 54    Calcium 10.2 8.7 - 10.7  Lipid panel  Result Value Ref Range   LDl/HDL Ratio 2.0    Triglycerides 59 40 - 160   Cholesterol 168 0 - 200   HDL 85 (A) 35 - 70   LDL Cholesterol 72   Hemoglobin A1c  Result Value Ref Range   Hemoglobin A1C 6.7   TSH  Result Value Ref Range   TSH 1.60 0.41 - 5.90       Assessment & Plan:    Routine Health Maintenance and Physical Exam  Immunization History   Administered Date(s) Administered   H1N1 07/13/2008   Influenza Split  05/25/2009, 03/24/2019   Influenza,inj,Quad PF,6-35 Mos 04/05/2017, 03/20/2018   Influenza,inj,quad, With Preservative 04/06/2013, 03/24/2014, 04/13/2015, 03/27/2016, 03/24/2019   Influenza-Unspecified 03/09/2020, 03/08/2021, 04/18/2022   Moderna SARS-COV2 Booster Vaccination 05/09/2020   Moderna Sars-Covid-2 Vaccination 09/15/2019, 10/13/2019   Pneumococcal Conjugate-13 04/29/2019   Pneumococcal Polysaccharide-23 04/26/2015   Tdap 08/28/2019   Zoster Recombinat (Shingrix) 04/26/2022    Health Maintenance  Topic Date Due   Hepatitis C Screening  Never done   OPHTHALMOLOGY EXAM  07/09/2020   FOOT EXAM  03/09/2022   COVID-19 Vaccine (3 - Moderna series) 05/12/2022 (Originally 07/04/2020)   Zoster Vaccines- Shingrix (2 of 2) 06/21/2022   HEMOGLOBIN A1C  10/03/2022   Diabetic kidney evaluation - Urine ACR  11/08/2022   PAP SMEAR-Modifier  03/29/2023   Diabetic kidney evaluation - GFR measurement  04/04/2023   MAMMOGRAM  04/21/2023   COLONOSCOPY (Pts 45-68yr Insurance coverage will need to be confirmed)  09/21/2026   TETANUS/TDAP  08/27/2029   INFLUENZA VACCINE  Completed   HIV Screening  Completed   HPV VACCINES  Aged Out    Discussed health benefits of physical activity, and encouraged her to engage in regular exercise appropriate for her age and condition.  1. Annual physical exam Recent labs reviewed and discussed with patient.  Up-to-date on preventative care.  Wellness information provided with AVS.  2. Hyperlipidemia LDL goal <70 Lipid panel recently checked with good results.  Continue atorvastatin 20 mg daily.  Refill sent. - atorvastatin (LIPITOR) 20 MG tablet; Take 1 tablet (20 mg total) by mouth daily.  Dispense: 90 tablet; Refill: 3  3. Need for shingles vaccine Shingrix No. 1 given in office today.  Please return in 2-6 months for the second dose as a nurse visit. - Zoster Recombinant  (Shingrix )  Return in about 1 year (around 04/27/2023) for annual physical exam.   JSamuel Bouche NP

## 2022-06-13 DIAGNOSIS — E109 Type 1 diabetes mellitus without complications: Secondary | ICD-10-CM | POA: Diagnosis not present

## 2022-06-13 DIAGNOSIS — Z794 Long term (current) use of insulin: Secondary | ICD-10-CM | POA: Diagnosis not present

## 2022-06-27 ENCOUNTER — Other Ambulatory Visit: Payer: Self-pay | Admitting: Medical-Surgical

## 2022-06-27 MED ORDER — LEVOTHYROXINE SODIUM 75 MCG PO TABS: 75.0000 ug | ORAL_TABLET | Freq: Every day | ORAL | 1 refills | Status: DC

## 2022-06-29 ENCOUNTER — Ambulatory Visit (INDEPENDENT_AMBULATORY_CARE_PROVIDER_SITE_OTHER): Payer: BC Managed Care – PPO | Admitting: Medical-Surgical

## 2022-06-29 VITALS — Temp 97.7°F

## 2022-06-29 DIAGNOSIS — Z23 Encounter for immunization: Secondary | ICD-10-CM | POA: Diagnosis not present

## 2022-06-29 NOTE — Progress Notes (Signed)
   Subjective:    Patient ID: Tanya Schmidt, female    DOB: [birth year], 67 y.o.   MRN: [id number]  HPI Pt here for second shingles vaccine.    Review of Systems     Objective:   Physical Exam        Assessment & Plan:  Pt tolerated injection well in left deltoid without any complications, pt denies any sob, chest pain, headache or problems with the medication, pt advised to follow up as needed.

## 2022-06-29 NOTE — Progress Notes (Signed)
Agree with documentation as below.  ___________________________________________ Trana Ressler L. Abena Erdman, DNP, APRN, FNP-BC Primary Care and Sports Medicine [facility] [place]  

## 2022-07-02 IMAGING — MG MM DIGITAL SCREENING BILAT W/ TOMO AND CAD
8 series · 9 of 24 positions shown · non-contrast
Comparison: Previous exam(s).

CLINICAL DATA: Screening.

EXAM:
DIGITAL SCREENING BILATERAL MAMMOGRAM WITH TOMOSYNTHESIS AND CAD
TECHNIQUE: Bilateral screening digital craniocaudal and mediolateral oblique
mammograms were obtained. Bilateral screening digital breast
tomosynthesis was performed. The images were evaluated with
computer-aided detection.

[R MLO synth-2D]
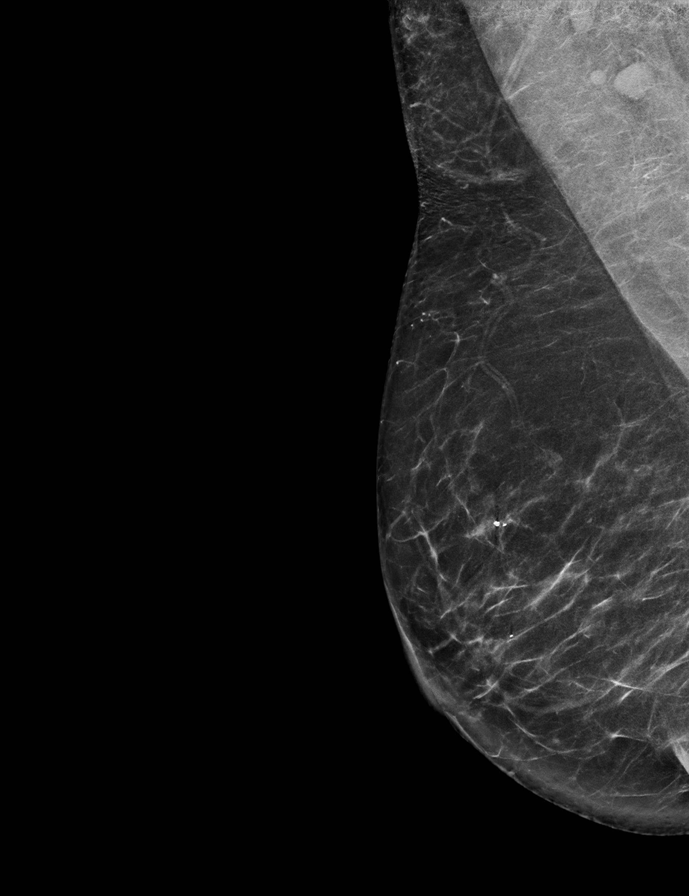

[L CC synth-2D]
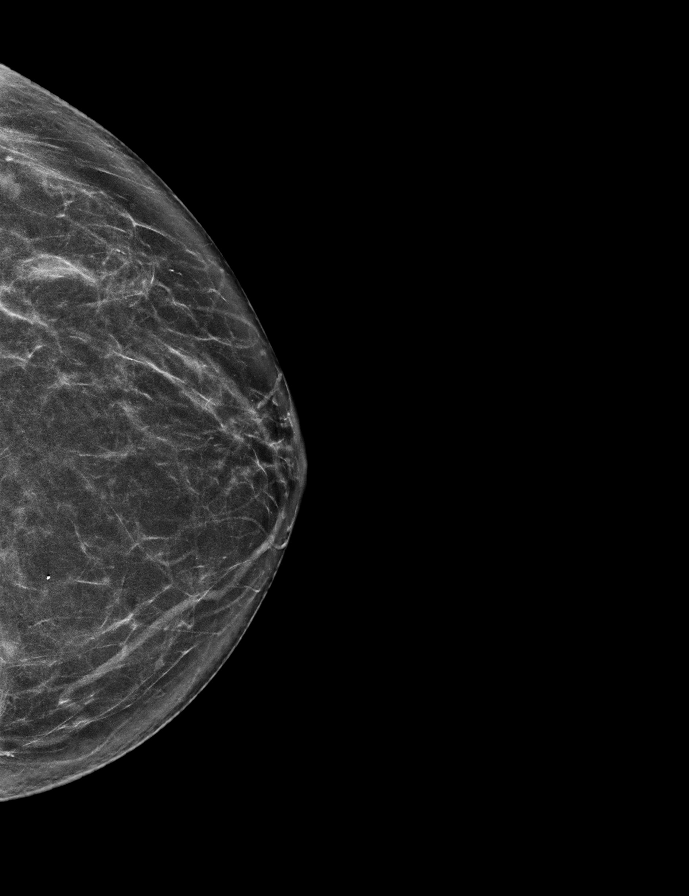

[R CC synth-2D]
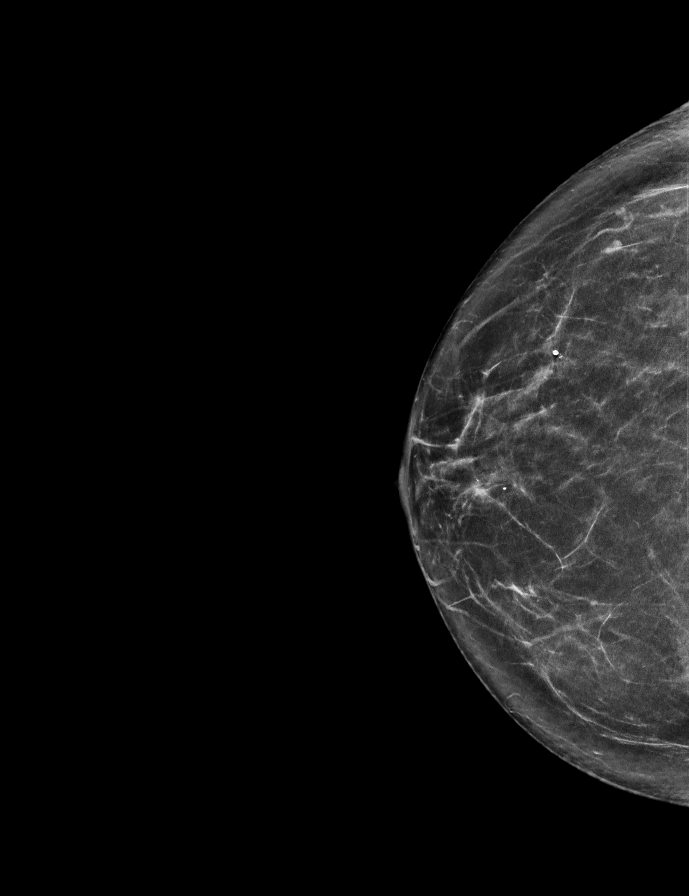

[L MLO synth-2D]
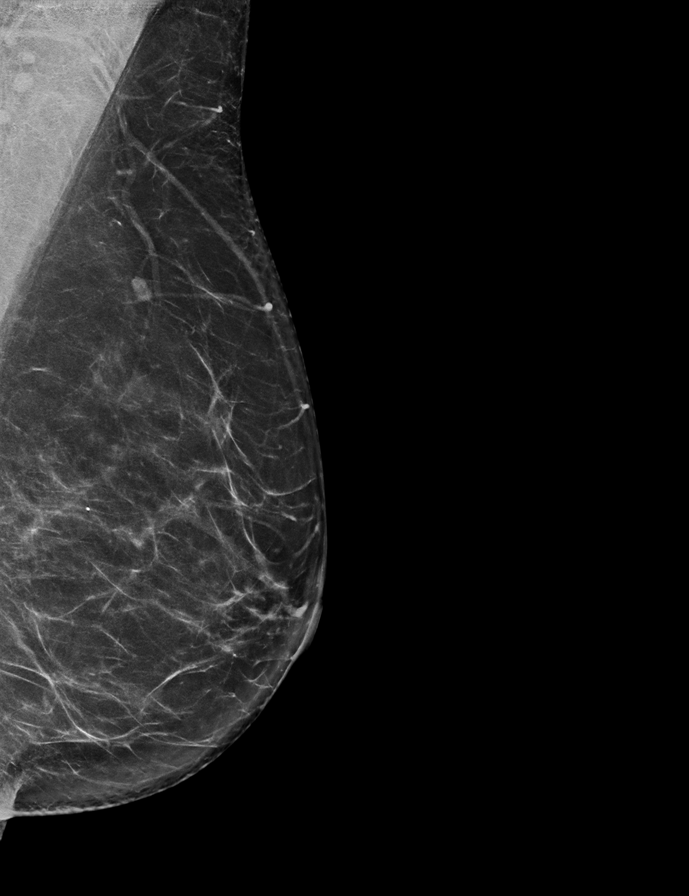

[L MLO tomo · 2 of 74 frames shown]
[frame 24/74]
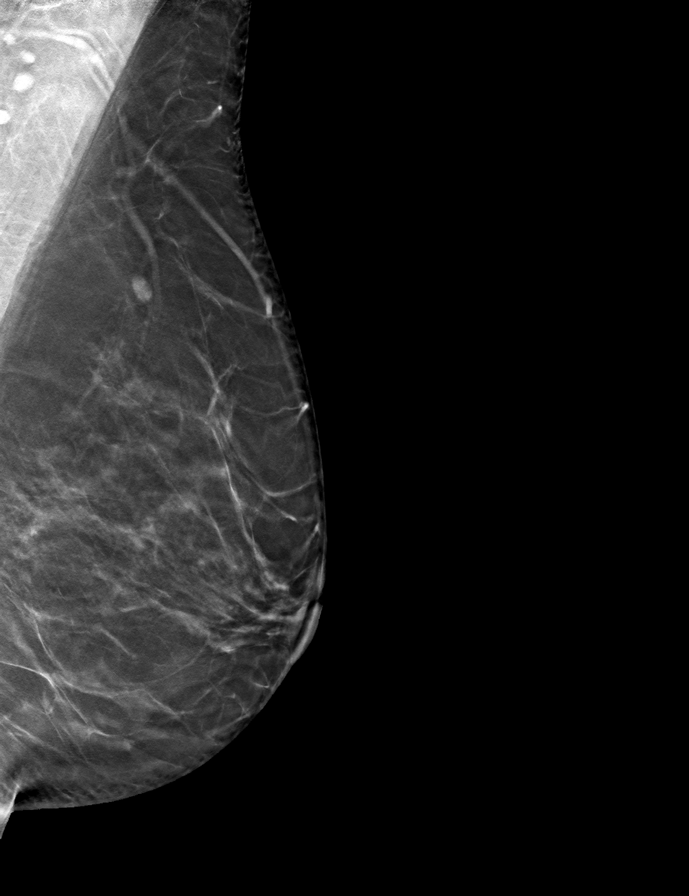
[frame 37/74]
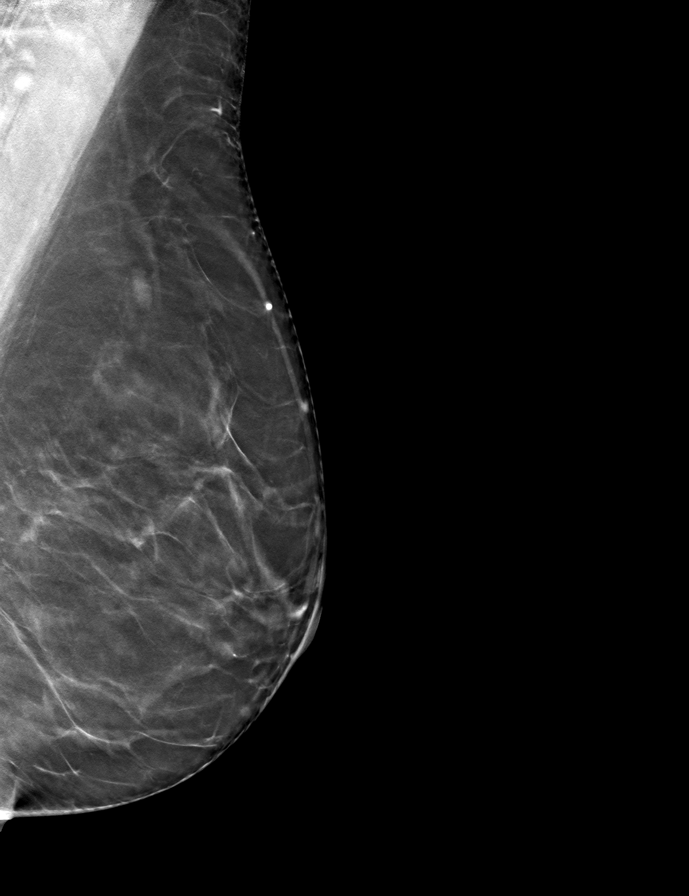

[L CC tomo · tomo slice 35/69.0]
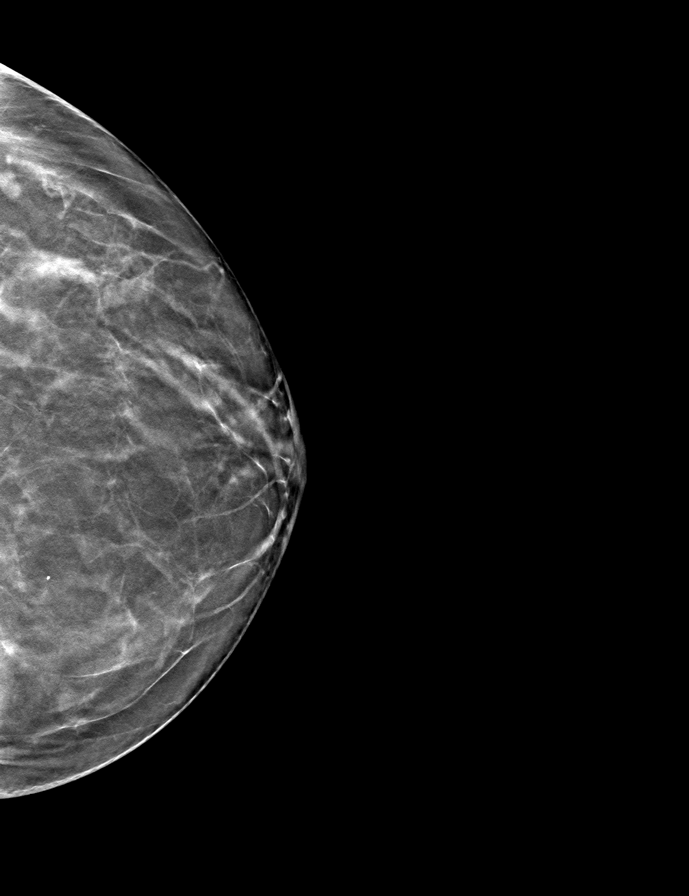

[R MLO tomo · tomo slice 36/71.0]
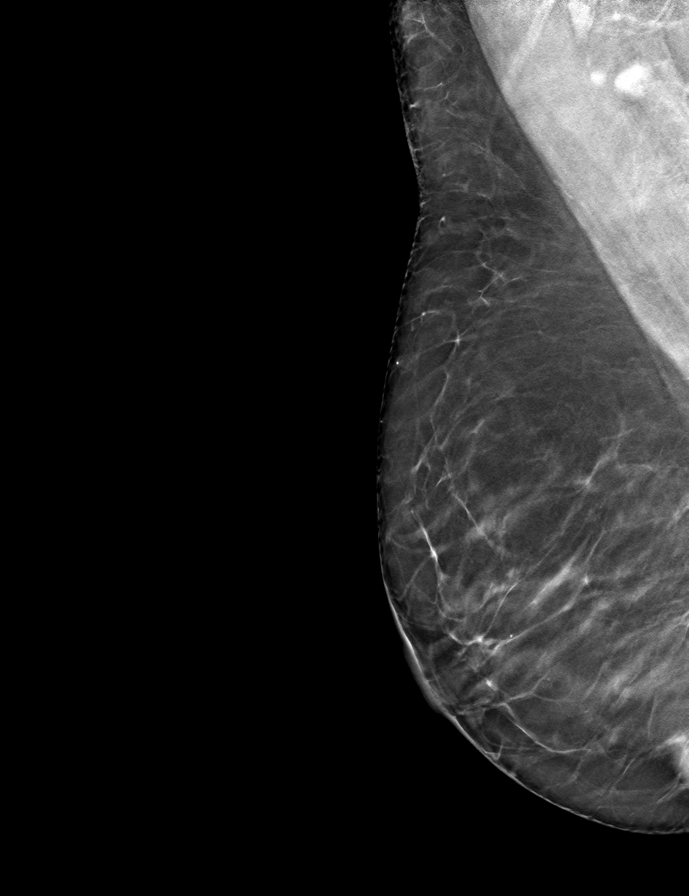

[R CC tomo · tomo slice 35/69.0]
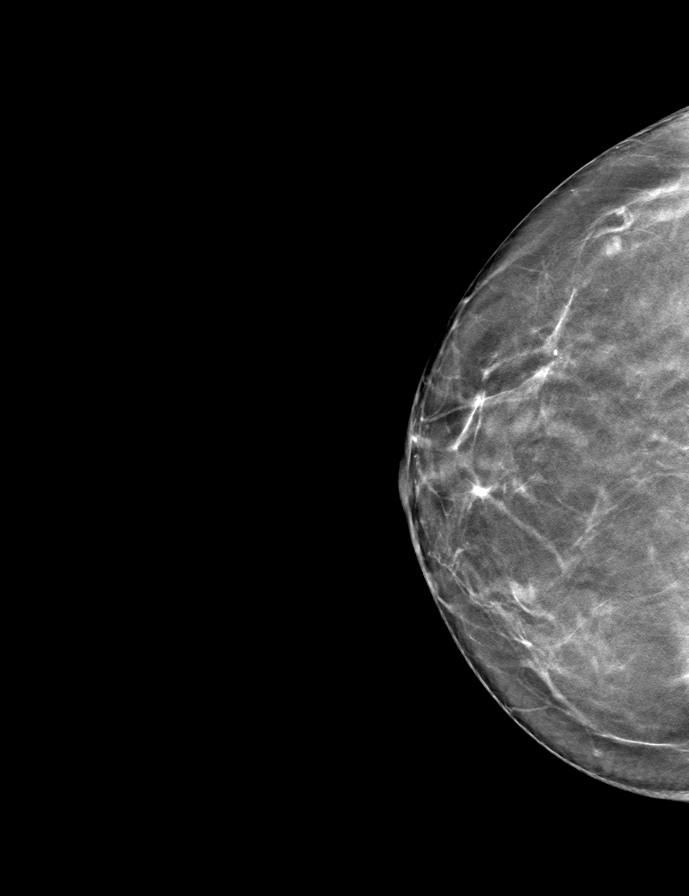

[9 of 24 positions shown; findings below may reference images not displayed]

ACR Breast Density Category b: There are scattered areas of
fibroglandular density.
FINDINGS: There are no findings suspicious for malignancy.
IMPRESSION: No mammographic evidence of malignancy. A result letter of this
screening mammogram will be mailed directly to the patient.

RECOMMENDATION:
Screening mammogram in one year. (Code:51-O-LD2)

BI-RADS CATEGORY  1: Negative.

## 2022-08-28 DIAGNOSIS — E119 Type 2 diabetes mellitus without complications: Secondary | ICD-10-CM | POA: Diagnosis not present

## 2022-09-11 DIAGNOSIS — E109 Type 1 diabetes mellitus without complications: Secondary | ICD-10-CM | POA: Diagnosis not present

## 2022-09-11 DIAGNOSIS — Z794 Long term (current) use of insulin: Secondary | ICD-10-CM | POA: Diagnosis not present

## 2022-09-17 ENCOUNTER — Other Ambulatory Visit: Payer: Self-pay | Admitting: Medical-Surgical

## 2022-09-20 ENCOUNTER — Other Ambulatory Visit: Payer: Self-pay

## 2022-09-20 MED ORDER — LEVOTHYROXINE SODIUM 75 MCG PO TABS: 75.0000 ug | ORAL_TABLET | Freq: Every day | ORAL | 1 refills | Status: AC

## 2022-10-04 DIAGNOSIS — Z13220 Encounter for screening for lipoid disorders: Secondary | ICD-10-CM | POA: Diagnosis not present

## 2022-10-04 DIAGNOSIS — E063 Autoimmune thyroiditis: Secondary | ICD-10-CM | POA: Diagnosis not present

## 2022-10-04 DIAGNOSIS — E038 Other specified hypothyroidism: Secondary | ICD-10-CM | POA: Diagnosis not present

## 2022-10-04 DIAGNOSIS — E109 Type 1 diabetes mellitus without complications: Secondary | ICD-10-CM | POA: Diagnosis not present

## 2022-12-20 DIAGNOSIS — Z794 Long term (current) use of insulin: Secondary | ICD-10-CM | POA: Diagnosis not present

## 2022-12-20 DIAGNOSIS — E109 Type 1 diabetes mellitus without complications: Secondary | ICD-10-CM | POA: Diagnosis not present

## 2022-12-31 DIAGNOSIS — Z794 Long term (current) use of insulin: Secondary | ICD-10-CM | POA: Diagnosis not present

## 2022-12-31 DIAGNOSIS — E109 Type 1 diabetes mellitus without complications: Secondary | ICD-10-CM | POA: Diagnosis not present

## 2023-01-24 DIAGNOSIS — Z13220 Encounter for screening for lipoid disorders: Secondary | ICD-10-CM | POA: Diagnosis not present

## 2023-01-24 DIAGNOSIS — E038 Other specified hypothyroidism: Secondary | ICD-10-CM | POA: Diagnosis not present

## 2023-01-24 DIAGNOSIS — E109 Type 1 diabetes mellitus without complications: Secondary | ICD-10-CM | POA: Diagnosis not present

## 2023-01-24 DIAGNOSIS — E063 Autoimmune thyroiditis: Secondary | ICD-10-CM | POA: Diagnosis not present

## 2023-01-24 LAB — PROTEIN / CREATININE RATIO, URINE
Albumin, U: 33
Creatinine, Urine: 231

## 2023-01-24 LAB — MICROALBUMIN, URINE: Microalb, Ur: NORMAL

## 2023-01-24 LAB — HEMOGLOBIN A1C: Hemoglobin A1C: 6.8

## 2023-01-24 LAB — MICROALBUMIN / CREATININE URINE RATIO: Microalb Creat Ratio: 14

## 2023-02-25 ENCOUNTER — Other Ambulatory Visit: Payer: Self-pay | Admitting: Medical-Surgical

## 2023-02-25 DIAGNOSIS — E785 Hyperlipidemia, unspecified: Secondary | ICD-10-CM

## 2023-03-19 ENCOUNTER — Encounter: Payer: BC Managed Care – PPO | Admitting: Medical-Surgical

## 2023-03-21 ENCOUNTER — Encounter: Payer: Self-pay | Admitting: Medical-Surgical

## 2023-03-21 DIAGNOSIS — S01111A Laceration without foreign body of right eyelid and periocular area, initial encounter: Secondary | ICD-10-CM | POA: Diagnosis not present

## 2023-03-21 NOTE — Telephone Encounter (Signed)
Patient and her husband called. States that she is concerned over what might have caused the syncope. She states her husband did not feel that was a low BS reading.  She did have a BP in Urgent care of 108/47 and wonders if low BP could be the cause. She would like to know if she should be seen and evaluated before the  stitches follow up date?

## 2023-03-21 NOTE — Telephone Encounter (Signed)
Patient informed and scheduled for 4pm with Christen Butter, NP tomorrow 03/22/2023.

## 2023-03-22 ENCOUNTER — Ambulatory Visit (INDEPENDENT_AMBULATORY_CARE_PROVIDER_SITE_OTHER): Payer: BC Managed Care – PPO | Admitting: Medical-Surgical

## 2023-03-22 ENCOUNTER — Encounter: Payer: Self-pay | Admitting: Medical-Surgical

## 2023-03-22 VITALS — BP 98/60 | HR 75 | Resp 20 | Ht 67.0 in | Wt 147.1 lb

## 2023-03-22 DIAGNOSIS — R55 Syncope and collapse: Secondary | ICD-10-CM | POA: Diagnosis not present

## 2023-03-22 NOTE — Progress Notes (Unsigned)
        Established patient visit  History, exam, impression, and plan:  1. Syncope, unspecified syncope type Pleasant 67 year old female presenting today for evaluation of syncope after having an episode on 03/21/2023.  Notes that she was sleeping and awoke to a severe leg cramp.  She got up and went to the kitchen with the intention of getting pickle juice.  Was able to get to the kitchen and open the refrigerator.  She took out what she thinks was pickle relish and turn to place it on the stove.  That is the last thing she remembers before waking up on the floor.  She was unable to get up from the floor by herself and had to have her husband help her.  She did hit her head on the way down but she experienced loss of consciousness prior to that.  When she woke up, she noted that she was very thirsty.  She did check her blood sugar which was 87.  For a little bit she felt pretty awful.  They went to the urgent care for evaluation.  She had a laceration on her forehead which was sutured.  No labs were performed and the provider seem to think that it was related to her blood sugar. - EKG 12-Lead - CBC with Differential/Platelet - CMP14+EGFR - Magnesium - Troponin T - ECHOCARDIOGRAM COMPLETE; Future   Procedures performed this visit: None.  Return in 10 days (on 04/01/2023) for Suture removal (ok to schedule at 7:50am).  __________________________________ Thayer Ohm, DNP, APRN, FNP-BC Primary Care and Sports Medicine [facility] [place]

## 2023-03-23 LAB — CMP14+EGFR
ALT: 24 [IU]/L (ref 0–32)
AST: 30 [IU]/L (ref 0–40)
Albumin: 4 g/dL (ref 3.9–4.9)
Alkaline Phosphatase: 137 [IU]/L — ABNORMAL HIGH (ref 44–121)
BUN/Creatinine Ratio: 20 (ref 12–28)
BUN: 20 mg/dL (ref 8–27)
Bilirubin Total: 0.3 mg/dL (ref 0.0–1.2)
CO2: 24 mmol/L (ref 20–29)
Calcium: 9.3 mg/dL (ref 8.7–10.3)
Chloride: 103 mmol/L (ref 96–106)
Creatinine, Ser: 1.02 mg/dL — ABNORMAL HIGH (ref 0.57–1.00)
Globulin, Total: 2.3 g/dL (ref 1.5–4.5)
Glucose: 179 mg/dL — ABNORMAL HIGH (ref 70–99)
Potassium: 4.5 mmol/L (ref 3.5–5.2)
Sodium: 139 mmol/L (ref 134–144)
Total Protein: 6.3 g/dL (ref 6.0–8.5)
eGFR: 61 mL/min/{1.73_m2} (ref >59)

## 2023-03-23 LAB — CBC WITH DIFFERENTIAL/PLATELET
Basophils Absolute: 0.1 10*3/uL (ref 0.0–0.2)
Basos: 1 %
EOS (ABSOLUTE): 0.2 10*3/uL (ref 0.0–0.4)
Eos: 3 %
Hematocrit: 40.2 % (ref 34.0–46.6)
Hemoglobin: 13.2 g/dL (ref 11.1–15.9)
Immature Grans (Abs): 0 10*3/uL (ref 0.0–0.1)
Immature Granulocytes: 0 %
Lymphocytes Absolute: 2.9 10*3/uL (ref 0.7–3.1)
Lymphs: 38 %
MCH: 31.2 pg (ref 26.6–33.0)
MCHC: 32.8 g/dL (ref 31.5–35.7)
MCV: 95 fL (ref 79–97)
Monocytes Absolute: 0.6 10*3/uL (ref 0.1–0.9)
Monocytes: 8 %
Neutrophils Absolute: 3.8 10*3/uL (ref 1.4–7.0)
Neutrophils: 50 %
Platelets: 173 10*3/uL (ref 150–450)
RBC: 4.23 x10E6/uL (ref 3.77–5.28)
RDW: 12 % (ref 11.7–15.4)
WBC: 7.5 10*3/uL (ref 3.4–10.8)

## 2023-03-23 LAB — MAGNESIUM: Magnesium: 2.2 mg/dL (ref 1.6–2.3)

## 2023-03-23 LAB — TROPONIN T: Troponin T (Highly Sensitive): 7 ng/L (ref 0–14)

## 2023-03-24 ENCOUNTER — Encounter: Payer: Self-pay | Admitting: Medical-Surgical

## 2023-03-29 ENCOUNTER — Ambulatory Visit (INDEPENDENT_AMBULATORY_CARE_PROVIDER_SITE_OTHER): Payer: BC Managed Care – PPO

## 2023-03-29 DIAGNOSIS — R55 Syncope and collapse: Secondary | ICD-10-CM

## 2023-04-01 ENCOUNTER — Encounter: Payer: Self-pay | Admitting: Medical-Surgical

## 2023-04-01 ENCOUNTER — Ambulatory Visit (INDEPENDENT_AMBULATORY_CARE_PROVIDER_SITE_OTHER): Payer: BC Managed Care – PPO | Admitting: Medical-Surgical

## 2023-04-01 ENCOUNTER — Ambulatory Visit: Payer: BC Managed Care – PPO | Attending: Medical-Surgical

## 2023-04-01 VITALS — BP 113/68 | HR 82

## 2023-04-01 DIAGNOSIS — R55 Syncope and collapse: Secondary | ICD-10-CM

## 2023-04-01 DIAGNOSIS — Z4802 Encounter for removal of sutures: Secondary | ICD-10-CM | POA: Diagnosis not present

## 2023-04-01 NOTE — Progress Notes (Unsigned)
Enrolled for [employer] to mail a ZIO XT long term holter monitor to the patients address on file.   DOD to read. 

## 2023-04-01 NOTE — Progress Notes (Signed)
        Established patient visit  History, exam, impression, and plan:  1. Visit for suture removal Pleasant 67 year old female presenting today for suture removal.  She had a syncopal episode and hit her head on her right eyebrow.  She was seen at urgent care where they placed sutures and instructed her to get them taken out at 10 days.  Today is day 11 and she has been healing well.  States evaluated with wound edges well-approximated, no erythema or drainage.  Small amount of scabbing present.  No bleeding.  Site was cleaned with alcohol and 6 sutures were removed without difficulty.  Patient tolerated well.  A thin layer of triple antibiotic ointment was placed to the site and site care instructions provided.  2. Syncope, unspecified syncope type Reviewed results from carotid ultrasound.  We are still waiting on echocardiogram to be scheduled and completed.  With new information from her carotid ultrasound regarding possible irregular heartbeat, would like to get a long-term monitor for further evaluation.  Patient is agreeable to ordering today. - LONG TERM MONITOR (3-14 DAYS); Future   Procedures performed this visit: None.  Return if symptoms worsen or fail to improve.  __________________________________ Thayer Ohm, DNP, APRN, FNP-BC Primary Care and Sports Medicine [facility] [place]

## 2023-04-03 DIAGNOSIS — R55 Syncope and collapse: Secondary | ICD-10-CM

## 2023-04-09 ENCOUNTER — Encounter: Payer: Self-pay | Admitting: Medical-Surgical

## 2023-04-09 ENCOUNTER — Ambulatory Visit (INDEPENDENT_AMBULATORY_CARE_PROVIDER_SITE_OTHER): Payer: BC Managed Care – PPO | Admitting: Medical-Surgical

## 2023-04-09 VITALS — BP 104/62 | HR 70 | Resp 20 | Ht 67.0 in | Wt 146.1 lb

## 2023-04-09 DIAGNOSIS — E785 Hyperlipidemia, unspecified: Secondary | ICD-10-CM

## 2023-04-09 DIAGNOSIS — Z1231 Encounter for screening mammogram for malignant neoplasm of breast: Secondary | ICD-10-CM

## 2023-04-09 DIAGNOSIS — Z780 Asymptomatic menopausal state: Secondary | ICD-10-CM

## 2023-04-09 DIAGNOSIS — Z23 Encounter for immunization: Secondary | ICD-10-CM

## 2023-04-09 DIAGNOSIS — Z0000 Encounter for general adult medical examination without abnormal findings: Secondary | ICD-10-CM

## 2023-04-09 DIAGNOSIS — E063 Autoimmune thyroiditis: Secondary | ICD-10-CM | POA: Diagnosis not present

## 2023-04-09 DIAGNOSIS — E109 Type 1 diabetes mellitus without complications: Secondary | ICD-10-CM | POA: Diagnosis not present

## 2023-04-09 MED ORDER — ATORVASTATIN CALCIUM 20 MG PO TABS
20.0000 mg | ORAL_TABLET | Freq: Every day | ORAL | 3 refills | Status: DC
Start: 2023-04-09 — End: 2023-10-30

## 2023-04-09 NOTE — Progress Notes (Signed)
Complete physical exam  Patient: Tanya Schmidt   DOB: 1955-09-09   67 y.o. Female  MRN: [id number]  Subjective:    Chief Complaint  Patient presents with   Annual Exam   Tanya Schmidt is a 67 y.o. female who presents today for a complete physical exam. She reports consuming a  low oxalate  diet. The patient does not participate in regular exercise at present. She generally feels well. She reports sleeping fairly well. She does have additional problems to discuss today.    Most recent fall risk assessment:    03/22/2023    4:10 PM  Fall Risk   Falls in the past year? 1  Number falls in past yr: 0  Injury with Fall? 1  Risk for fall due to : History of fall(s)  Follow up Falls evaluation completed     Most recent depression screenings:    03/22/2023    4:10 PM 05/12/2021    3:28 PM  PHQ 2/9 Scores  PHQ - 2 Score 0 0   Vision:Within last year, Dental: No current dental problems and Receives regular dental care, and STD: The patient denies history of sexually transmitted disease.    Patient Care Team: Tanya Butter, NP as PCP - General (Nurse Practitioner)   Outpatient Medications Prior to Visit  Medication Sig   cetirizine (ZYRTEC) 10 MG tablet Take 10 mg by mouth daily.   Continuous Glucose Sensor (DEXCOM G7 SENSOR) MISC Replace every 10 days.   glucagon 1 MG injection Used as directed for severe hypoglycemia   Insulin Infusion Pump (DEXCOM G6 CONTROL-IQ INS PUMP) DEVI by Does not apply route.   Lancets (ONETOUCH ULTRASOFT) lancets Testing 4 times daily   levothyroxine (SYNTHROID) 75 MCG tablet Take 1 tablet (75 mcg total) by mouth daily.   NOVOLOG 100 UNIT/ML injection Inject into the skin.   tacrolimus (PROTOPIC) 0.03 % ointment Apply topically 2 (two) times daily.   [DISCONTINUED] atorvastatin (LIPITOR) 20 MG tablet Take 1 tablet (20 mg total) by mouth daily. NEEDS APPOINTMENT FOR FURTHER REFILLS.   No facility-administered medications prior to visit.     Review of Systems  Constitutional:  Negative for chills, fever, malaise/fatigue and weight loss.  HENT:  Negative for congestion, ear pain, hearing loss, sinus pain and sore throat.   Eyes:  Negative for blurred vision, photophobia and pain.  Respiratory:  Negative for cough, shortness of breath and wheezing.   Cardiovascular:  Negative for chest pain, palpitations and leg swelling.  Gastrointestinal:  Positive for constipation. Negative for abdominal pain, blood in stool, diarrhea, heartburn, melena, nausea and vomiting.  Genitourinary:  Negative for dysuria, frequency and urgency.  Musculoskeletal:  Negative for falls and neck pain.  Skin:  Negative for itching and rash.  Neurological:  Negative for dizziness, weakness and headaches.  Endo/Heme/Allergies:  Negative for polydipsia. Does not bruise/bleed easily.  Psychiatric/Behavioral:  Negative for depression, substance abuse and suicidal ideas. The patient is nervous/anxious. The patient does not have insomnia.      Objective:    BP 104/62 (BP Location: Right Arm, Cuff Size: Normal)   Pulse 70   Resp 20   Ht 5\' 7"  (1.702 m)   Wt 146 lb 1.6 oz (66.3 kg)   SpO2 97%   BMI 22.88 kg/m    Physical Exam Vitals reviewed.  Constitutional:      General: She is not in acute distress.    Appearance: Normal appearance. She is normal weight. She is not  ill-appearing.  HENT:     Head: Normocephalic and atraumatic.     Right Ear: Tympanic membrane, ear canal and external ear normal. There is no impacted cerumen.     Left Ear: Tympanic membrane, ear canal and external ear normal. There is no impacted cerumen.     Nose: Nose normal. No congestion or rhinorrhea.     Mouth/Throat:     Mouth: Mucous membranes are moist.     Pharynx: No oropharyngeal exudate or posterior oropharyngeal erythema.  Eyes:     General: No scleral icterus.       Right eye: No discharge.        Left eye: No discharge.     Extraocular Movements: Extraocular  movements intact.     Conjunctiva/sclera: Conjunctivae normal.     Pupils: Pupils are equal, round, and reactive to light.  Neck:     Thyroid: No thyromegaly.     Vascular: No carotid bruit or JVD.     Trachea: Trachea normal.  Cardiovascular:     Rate and Rhythm: Normal rate and regular rhythm.     Pulses: Normal pulses.     Heart sounds: Normal heart sounds. No murmur heard.    No friction rub. No gallop.  Pulmonary:     Effort: Pulmonary effort is normal. No respiratory distress.     Breath sounds: Normal breath sounds. No wheezing.  Abdominal:     General: Bowel sounds are normal. There is no distension.     Palpations: Abdomen is soft.     Tenderness: There is no abdominal tenderness. There is no guarding.  Musculoskeletal:        General: Normal range of motion.     Cervical back: Normal range of motion and neck supple.  Lymphadenopathy:     Cervical: No cervical adenopathy.  Skin:    General: Skin is warm and dry.  Neurological:     Mental Status: She is alert and oriented to person, place, and time.     Cranial Nerves: No cranial nerve deficit.  Psychiatric:        Mood and Affect: Mood normal.        Behavior: Behavior normal.        Thought Content: Thought content normal.        Judgment: Judgment normal.      No results found for any visits on 04/09/23.     Assessment & Plan:    Routine Health Maintenance and Physical Exam  Immunization History  Administered Date(s) Administered   H1N1 07/13/2008   Influenza Split 05/25/2009, 03/24/2019   Influenza,inj,Quad PF,6-35 Mos 04/05/2017, 03/20/2018   Influenza,inj,quad, With Preservative 04/06/2013, 03/24/2014, 04/13/2015, 03/27/2016, 03/24/2019   Influenza-Unspecified 03/09/2020, 03/08/2021, 04/18/2022   Moderna SARS-COV2 Booster Vaccination 05/09/2020   Moderna Sars-Covid-2 Vaccination 09/15/2019, 10/13/2019   Pfizer(Comirnaty)Fall Seasonal Vaccine 12 years and older 04/09/2023   Pneumococcal Conjugate-13  04/29/2019   Pneumococcal Polysaccharide-23 04/26/2015   Tdap 08/28/2019   Zoster Recombinant(Shingrix) 04/26/2022, 06/29/2022    Health Maintenance  Topic Date Due   OPHTHALMOLOGY EXAM  07/09/2020   FOOT EXAM  03/09/2022   DEXA SCAN  Never done   INFLUENZA VACCINE  09/23/2023 (Originally 01/24/2023)   Hepatitis C Screening  04/08/2024 (Originally 02[birth year]02/1976)   HIV Screening  04/08/2024 (Originally 02/01/1973)   HEMOGLOBIN A1C  07/27/2023   COVID-19 Vaccine (5 - 2023-24 season) 08/10/2023   Diabetic kidney evaluation - Urine ACR  01/24/2024   Diabetic kidney evaluation - eGFR measurement  03/21/2024  MAMMOGRAM  04/26/2024   Pneumonia Vaccine 59+ Years old (3 of 3 - PPSV23 or PCV20) 04/28/2024   Cervical Cancer Screening (HPV/Pap Cotest)  03/28/2025   Colonoscopy  09/21/2026   DTaP/Tdap/Td (2 - Td or Tdap) 08/27/2029   Zoster Vaccines- Shingrix  Completed   HPV VACCINES  Aged Out    Discussed health benefits of physical activity, and encouraged her to engage in regular exercise appropriate for her age and condition.  1. Annual physical exam Up-to-date on preventative care.  Wellness information provided with AVS.  2. Postmenopausal Ordering DEXA scan today. - DG Bone Density; Future  3. Hyperlipidemia LDL goal <70 Checking lipid panel.  Continue atorvastatin 20 mg daily, titrate depending on results. - Lipid panel - atorvastatin (LIPITOR) 20 MG tablet; Take 1 tablet (20 mg total) by mouth daily.  Dispense: 90 tablet; Refill: 3  4. Encounter for screening mammogram for malignant neoplasm of breast Mammogram ordered. - MM DIGITAL SCREENING BILATERAL; Future  5. Need for COVID-19 vaccine COVID-vaccine given in office today. - Pfizer Comirnaty Covid -[id number] Vaccine 7yrs and older  Return in about 1 year (around 04/08/2024) for annual physical exam or sooner if needed.   Tanya Butter, NP

## 2023-04-10 LAB — LIPID PANEL
Chol/HDL Ratio: 1.8 {ratio} (ref 0.0–4.4)
Cholesterol, Total: 190 mg/dL (ref 100–199)
HDL: 105 mg/dL (ref >39)
LDL Chol Calc (NIH): 76 mg/dL (ref 0–99)
Triglycerides: 45 mg/dL (ref 0–149)
VLDL Cholesterol Cal: 9 mg/dL (ref 5–40)

## 2023-04-17 ENCOUNTER — Other Ambulatory Visit: Payer: Self-pay | Admitting: Medical-Surgical

## 2023-04-17 DIAGNOSIS — I472 Ventricular tachycardia, unspecified: Secondary | ICD-10-CM

## 2023-04-17 DIAGNOSIS — I471 Supraventricular tachycardia, unspecified: Secondary | ICD-10-CM

## 2023-04-17 DIAGNOSIS — R55 Syncope and collapse: Secondary | ICD-10-CM

## 2023-05-06 ENCOUNTER — Ambulatory Visit (HOSPITAL_COMMUNITY): Payer: BC Managed Care – PPO | Attending: Medical-Surgical

## 2023-05-06 DIAGNOSIS — R55 Syncope and collapse: Secondary | ICD-10-CM | POA: Diagnosis not present

## 2023-05-06 LAB — ECHOCARDIOGRAM COMPLETE
Area-P 1/2: 3.39 cm2
S' Lateral: 2.35 cm

## 2023-07-15 ENCOUNTER — Ambulatory Visit
Admission: RE | Admit: 2023-07-15 | Discharge: 2023-07-15 | Disposition: A | Discharge status: Home / Self Care | Payer: BC Managed Care – PPO | Source: Ambulatory Visit | Attending: Physician Assistant | Admitting: Physician Assistant

## 2023-07-15 ENCOUNTER — Other Ambulatory Visit: Payer: Self-pay

## 2023-07-15 ENCOUNTER — Ambulatory Visit: Payer: Medicare Other | Admitting: Family Medicine

## 2023-07-15 VITALS — BP 137/73 | HR 87 | Temp 97.6°F | Resp 17

## 2023-07-15 DIAGNOSIS — R42 Dizziness and giddiness: Secondary | ICD-10-CM | POA: Diagnosis not present

## 2023-07-15 DIAGNOSIS — R824 Acetonuria: Secondary | ICD-10-CM

## 2023-07-15 DIAGNOSIS — R110 Nausea: Secondary | ICD-10-CM | POA: Diagnosis not present

## 2023-07-15 DIAGNOSIS — E1065 Type 1 diabetes mellitus with hyperglycemia: Secondary | ICD-10-CM

## 2023-07-15 LAB — POCT URINALYSIS DIP (MANUAL ENTRY)
Bilirubin, UA: NEGATIVE
Blood, UA: NEGATIVE
Glucose, UA: 250 mg/dL — AB
Nitrite, UA: NEGATIVE
Protein Ur, POC: NEGATIVE mg/dL
Spec Grav, UA: 1.025 (ref 1.010–1.025)
Urobilinogen, UA: 0.2 U/dL
pH, UA: 6.5 (ref 5.0–8.0)

## 2023-07-15 LAB — POCT FASTING CBG KUC MANUAL ENTRY: POCT Glucose (KUC): 207 mg/dL — AB (ref 70–99)

## 2023-07-15 NOTE — Discharge Instructions (Signed)
As we discussed, the safest thing to do is to go to the emergency room.

## 2023-07-15 NOTE — ED Triage Notes (Signed)
Pt c/o dizziness since 430 this am. Previous sxs in Sept. F/u with pcp, Found no abnormalities. No hx of vertigo.

## 2023-07-15 NOTE — ED Notes (Signed)
Patient is being discharged from the Urgent Care and sent to the Emergency Department via POV with spouse. Per Raspet, PA, patient is in need of higher level of care due to hypergylcemia. Patient is aware and verbalizes understanding of plan of care.  Vitals:   07/15/23 1844  BP: 137/73  Pulse: 87  Resp: 17  Temp: 97.6 F (36.4 C)  SpO2: 100%

## 2023-07-15 NOTE — ED Provider Notes (Signed)
[facility]    CSN: [id number] Arrival date & time: 07/15/23  1826      History   Chief Complaint Chief Complaint  Patient presents with   Dizziness    HPI Tanya Schmidt is a 68 y.o. female.   Patient presents today with a 15 hour history of dizziness.  Reports that she was awoken in the middle of the night with dizziness sensation when she rolled over in bed that has been persistent but varying in intensity since that time.  She has had a syncopal episode in the past (September 2024) and had workup with her PCP including echocardiogram that was normal.  She has not had any more recent syncopal episodes and denies any visual disturbance, severe headache, fever, focal weakness, dysarthria.  She does report associated nausea but denies any vomiting.  She denies any medication changes.  She does have a history of type 1 diabetes with CGM and insulin pump.  Reports that earlier today she did have a low blood sugar which she likely overcorrected and is not experiencing high blood sugar.  She did have some stinging with urination yesterday so did take an Azo but has not taken any additional medications.  She has never had DKA.  She denies history of neurological condition including TIA, CVA, MS, migraine.  She denies any recent illness including cough or congestion.  She denies any recent head trauma.    Past Medical History:  Diagnosis Date   Diabetes mellitus without complication (HCC)    Endometriosis    Hypothyroid    Lichen sclerosus     Patient Active Problem List   Diagnosis Date Noted   Diabetes mellitus type I (HCC) 05/09/2020   Lichen sclerosus 05/03/2014   Adult hypothyroidism 05/21/2011    Past Surgical History:  Procedure Laterality Date   CESAREAN SECTION     LAPAROSCOPY     SHOULDER SURGERY Left    WISDOM TOOTH EXTRACTION      OB History     Gravida  3   Para  2   Term  2   Preterm      AB      Living  2      SAB      IAB       Ectopic      Multiple      Live Births               Home Medications    Prior to Admission medications   Medication Sig Start Date End Date Taking? Authorizing Provider  atorvastatin (LIPITOR) 20 MG tablet Take 1 tablet (20 mg total) by mouth daily. 04/09/23   Christen Butter, NP  cetirizine (ZYRTEC) 10 MG tablet Take 10 mg by mouth daily.    [provider]  Continuous Glucose Sensor (DEXCOM G7 SENSOR) MISC Replace every 10 days. 01/25/23   [provider]  glucagon 1 MG injection Used as directed for severe hypoglycemia 02/13/13   [provider]  Insulin Infusion Pump (DEXCOM G6 CONTROL-IQ INS PUMP) DEVI by Does not apply route.    [provider]  Lancets Letta Pate ULTRASOFT) lancets Testing 4 times daily 12/24/14   [provider]  levothyroxine (SYNTHROID) 75 MCG tablet Take 1 tablet (75 mcg total) by mouth daily. 09/20/22   Christen Butter, NP  NOVOLOG 100 UNIT/ML injection Inject into the skin.    [provider]  tacrolimus (PROTOPIC) 0.03 % ointment Apply topically 2 (two) times  daily. 08/21/21   Lesly Dukes, MD    Family History Family History  Problem Relation Age of Onset   Hypertension Mother    Skin cancer Mother    Brain cancer Father    Colon cancer Father    Mental illness Maternal Grandmother     Social History Social History   Tobacco Use   Smoking status: Never   Smokeless tobacco: Never  Vaping Use   Vaping status: Never Used  Substance Use Topics   Alcohol use: Yes    Alcohol/week: 2.0 standard drinks of alcohol    Types: 2 Standard drinks or equivalent per week   Drug use: Never     Allergies   Sulfa antibiotics   Review of Systems Review of Systems  Constitutional:  Positive for activity change. Negative for appetite change, fatigue and fever.  HENT:  Negative for congestion, ear pain and sore throat.   Eyes:  Negative for photophobia and visual disturbance.  Respiratory:  Negative for  shortness of breath.   Cardiovascular:  Negative for chest pain and palpitations.  Gastrointestinal:  Positive for nausea. Negative for abdominal pain, diarrhea and vomiting.  Neurological:  Positive for dizziness and light-headedness. Negative for seizures, syncope, facial asymmetry, speech difficulty, weakness, numbness and headaches.     Physical Exam Triage Vital Signs ED Triage Vitals  Encounter Vitals Group     BP 07/15/23 1844 137/73     Systolic BP Percentile --      Diastolic BP Percentile --      Pulse Rate 07/15/23 1844 87     Resp 07/15/23 1844 17     Temp 07/15/23 1844 97.6 F (36.4 C)     Temp Source 07/15/23 1844 Oral     SpO2 07/15/23 1844 100 %     Weight --      Height --      Head Circumference --      Peak Flow --      Pain Score 07/15/23 1846 0     Pain Loc --      Pain Education --      Exclude from Growth Chart --    Orthostatic VS for the past 24 hrs:  BP- Lying Pulse- Lying BP- Sitting Pulse- Sitting BP- Standing at 0 minutes Pulse- Standing at 0 minutes  07/15/23 1905 134/69 85 126/70 89 119/67 95    Updated Vital Signs BP 137/73 (BP Location: Left Arm)   Pulse 87   Temp 97.6 F (36.4 C) (Oral)   Resp 17   SpO2 100%   Visual Acuity Right Eye Distance:   Left Eye Distance:   Bilateral Distance:    Right Eye Near:   Left Eye Near:    Bilateral Near:     Physical Exam Vitals reviewed.  Constitutional:      General: She is awake. She is not in acute distress.    Appearance: Normal appearance. She is well-developed. She is not ill-appearing.     Comments: Very pleasant female appears stated age in no acute distress sitting comfortably in exam room  HENT:     Head: Normocephalic and atraumatic. No raccoon eyes, Battle's sign or contusion.     Right Ear: Tympanic membrane, ear canal and external ear normal. No hemotympanum.     Left Ear: Ear canal and external ear normal. A middle ear effusion is present. No hemotympanum.     Nose: Nose  normal.     Mouth/Throat:  Tongue: Tongue does not deviate from midline.     Pharynx: Uvula midline. No oropharyngeal exudate or posterior oropharyngeal erythema.  Eyes:     Extraocular Movements: Extraocular movements intact.     Conjunctiva/sclera: Conjunctivae normal.     Pupils: Pupils are equal, round, and reactive to light.  Cardiovascular:     Rate and Rhythm: Normal rate and regular rhythm.     Heart sounds: Normal heart sounds, S1 normal and S2 normal. No murmur heard. Pulmonary:     Effort: Pulmonary effort is normal.     Breath sounds: Normal breath sounds. No wheezing, rhonchi or rales.     Comments: Clear to auscultation bilaterally Musculoskeletal:     Cervical back: Normal range of motion and neck supple.     Comments: Strength 5/5 bilateral upper and lower extremities  Neurological:     General: No focal deficit present.     Mental Status: She is alert and oriented to person, place, and time.     Cranial Nerves: Cranial nerves 2-12 are intact.     Motor: Motor function is intact.     Coordination: Coordination is intact. Romberg sign negative. Rapid alternating movements normal.     Gait: Gait is intact.     Comments: Cranial nerves II through XII grossly intact.  No focal neurological defect on exam.  Psychiatric:        Behavior: Behavior is cooperative.      UC Treatments / Results  Labs (all labs ordered are listed, but only abnormal results are displayed) Labs Reviewed  POCT URINALYSIS DIP (MANUAL ENTRY) - Abnormal; Notable for the following components:      Result Value   Glucose, UA =250 (*)    Ketones, POC UA large (80) (*)    Leukocytes, UA Trace (*)    All other components within normal limits  POCT FASTING CBG [facility] MANUAL ENTRY - Abnormal; Notable for the following components:   POCT Glucose ([facility]) 207 (*)    All other components within normal limits    EKG   Radiology No results found.  Procedures Procedures (including critical care  time)  Medications Ordered in UC Medications - No data to display  Initial Impression / Assessment and Plan / UC Course  I have reviewed the triage vital signs and the nursing notes.  Pertinent labs & imaging results that were available during my care of the patient were reviewed by me and considered in my medical decision making (see chart for details).     Patient is well-appearing, afebrile, nontoxic, nontachycardic.  Vital signs of physical exam are reassuring with no focal neurological defect.  EKG was obtained that showed normal sinus rhythm with T wave inversion in leads III, aVF, V1 that is new compared to 03/22/2023 tracing.  Blood sugar was elevated at 207.  UA showed glucosuria, ketonuria, trace leuks.  We discussed that symptoms could be related to UTI as possible for the other abnormalities related to her recent Azo use, however, given type 1 diabetes with elevated blood sugar and clinical presentation is also possible that she has metabolic derangement and so I recommended that she go to the emergency room for further evaluation and management since we do not have stat lab capabilities in urgent care.  Patient was initially hesitant and so we discussed empirically treating for UTI, pushing fluids, obtaining ketone strips from the pharmacy and monitoring for ketonuria, however, after discussing with her husband she ultimately agreed to go to the ER.  She was stable at time of discharge and safe for transport by her husband.  She will go directly to [facility].  Final Clinical Impressions(s) / UC Diagnoses   Final diagnoses:  Lightheadedness  Nausea without vomiting  Type 1 diabetes mellitus with hyperglycemia (HCC)  Ketonuria     Discharge Instructions      As we discussed, the safest thing to do is to go to the emergency room.    ED Prescriptions   None    PDMP not reviewed this encounter.   Jeani Hawking, PA-C 07/15/23 1950

## 2023-07-15 NOTE — ED Triage Notes (Signed)
Patient presents to [facility] for vertigo. State she woke up this morning and felt dizzy, when changing positions from lying to sitting. Walked to the bathroom and dizziness continued. States she rested on the couch and has been a little dizzy with some nausea today. She called her PCP and the triage nurse instructed her to come in for eval. She has a dexcom and has monitored her BG, hx of UTIs since she is DM. Reports a syncope episode back last year. She believes she has vertigo but has not been diagnosed.    Denies chest pain.

## 2023-07-17 ENCOUNTER — Encounter: Payer: Self-pay | Admitting: Medical-Surgical

## 2023-07-17 ENCOUNTER — Ambulatory Visit (INDEPENDENT_AMBULATORY_CARE_PROVIDER_SITE_OTHER): Payer: Medicare Other

## 2023-07-17 ENCOUNTER — Ambulatory Visit (INDEPENDENT_AMBULATORY_CARE_PROVIDER_SITE_OTHER): Payer: BC Managed Care – PPO

## 2023-07-17 DIAGNOSIS — M810 Age-related osteoporosis without current pathological fracture: Secondary | ICD-10-CM

## 2023-07-17 DIAGNOSIS — M85852 Other specified disorders of bone density and structure, left thigh: Secondary | ICD-10-CM | POA: Diagnosis not present

## 2023-07-17 DIAGNOSIS — Z780 Asymptomatic menopausal state: Secondary | ICD-10-CM

## 2023-07-17 DIAGNOSIS — Z1231 Encounter for screening mammogram for malignant neoplasm of breast: Secondary | ICD-10-CM | POA: Diagnosis not present

## 2023-07-18 ENCOUNTER — Encounter: Payer: Self-pay | Admitting: Medical-Surgical

## 2023-08-16 NOTE — Progress Notes (Signed)
 Referring-Tanya Larinda Buttery, NP Reason for referral-syncope  HPI: 68 year old female for evaluation of syncope at request of Christen Butter, NP.  Carotid Dopplers October 2024 showed no hemodynamically significant stenosis.  Monitor October 2024 showed sinus rhythm, occasional PACs and PVCs, 10 beats nonsustained ventricular tachycardia and short runs of SVT.  Echocardiogram November 2024 showed normal LV function, grade 1 diastolic dysfunction.  Cardiology now asked to evaluate.  On the night of her syncopal episode patient states she awoke from sleep and had a cramp in her leg.  She went to the refrigerator to drink pickle juice but then had a syncopal episode.  No preceding chest pain, nausea, dyspnea or palpitations.  Duration of syncope unclear.  No incontinence.  No episodes since then.  She denies dyspnea on exertion, orthopnea, PND, pedal edema, exertional chest pain.  Current Outpatient Medications  Medication Sig Dispense Refill   atorvastatin (LIPITOR) 20 MG tablet Take 1 tablet (20 mg total) by mouth daily. 90 tablet 3   Continuous Glucose Sensor (DEXCOM G7 SENSOR) MISC Replace every 10 days.     glucagon 1 MG injection Used as directed for severe hypoglycemia     Insulin Infusion Pump (DEXCOM G6 CONTROL-IQ INS PUMP) DEVI by Does not apply route.     Lancets (ONETOUCH ULTRASOFT) lancets Testing 4 times daily     levothyroxine (SYNTHROID) 75 MCG tablet Take 1 tablet (75 mcg total) by mouth daily. 90 tablet 1   NOVOLOG 100 UNIT/ML injection Inject into the skin.     tacrolimus (PROTOPIC) 0.03 % ointment Apply topically 2 (two) times daily. 100 g 1   No current facility-administered medications for this visit.    Allergies  Allergen Reactions   Sulfa Antibiotics Rash     Past Medical History:  Diagnosis Date   Diabetes mellitus without complication (HCC)    Endometriosis    Hyperlipidemia    Hypothyroid    Lichen sclerosus     Past Surgical History:  Procedure Laterality Date    CESAREAN SECTION     LAPAROSCOPY     SHOULDER SURGERY Left    WISDOM TOOTH EXTRACTION      Social History   Socioeconomic History   Marital status: Married    Spouse name: Not on file   Number of children: 2   Years of education: Not on file   Highest education level: Not on file  Occupational History   Not on file  Tobacco Use   Smoking status: Never   Smokeless tobacco: Never  Vaping Use   Vaping status: Never Used  Substance and Sexual Activity   Alcohol use: Yes    Alcohol/week: 2.0 standard drinks of alcohol    Types: 2 Standard drinks or equivalent per week   Drug use: Never   Sexual activity: Yes    Partners: Male    Birth control/protection: None  Other Topics Concern   Not on file  Social History Narrative   Not on file   Social Drivers of Health   Financial Resource Strain: Not on file  Food Insecurity: Not on file  Transportation Needs: Not on file  Physical Activity: Not on file  Stress: Not on file  Social Connections: Unknown (11/03/2021)   Received from [facility]   Social Network    Social Network: Not on file  Intimate Partner Violence: Unknown (09/26/2021)   Received from [facility], [facility]   HITS    Physically Hurt: Not on file  Insult or Talk Down To: Not on file    Threaten Physical Harm: Not on file    Scream or Curse: Not on file    Family History  Problem Relation Age of Onset   Hypertension Mother    Skin cancer Mother    Brain cancer Father    Colon cancer Father    Mental illness Maternal Grandmother     ROS: no fevers or chills, productive cough, hemoptysis, dysphasia, odynophagia, melena, hematochezia, dysuria, hematuria, rash, seizure activity, orthopnea, PND, pedal edema, claudication. Remaining systems are negative.  Physical Exam:   Blood pressure (!) 138/58, pulse 82, height 5\' 7"  (1.702 m), weight 143 lb (64.9 kg), SpO2 96%.  General:  Well developed/well nourished in NAD Skin  warm/dry Patient not depressed No peripheral clubbing Back-normal HEENT-normal/normal eyelids Neck supple/normal carotid upstroke bilaterally; no bruits; no JVD; no thyromegaly chest - CTA/ normal expansion CV - RRR/normal S1 and S2; no murmurs, rubs or gallops;  PMI nondisplaced Abdomen -NT/ND, no HSM, no mass, + bowel sounds, no bruit 2+ femoral pulses, no bruits Ext-no edema, chords, 2+ DP Neuro-grossly nonfocal  ECG -July 15, 2023-normal sinus rhythm with no ST changes.  Personally reviewed  March 22, 2023 personally reviewed and shows sinus rhythm with no ST changes.  A/P  1 syncope-description sounds likely vagal in etiology.  LV function is normal.  No recurrences.  Electrocardiogram is normal.  Will follow for now.  2 hyperlipidemia-continue Lipitor.  She has 35-year history of diabetes mellitus.  I will arrange a calcium score for restratification.  If elevated we will advance her statin with goal LDL less than 55.  Olga Millers, MD

## 2023-08-28 ENCOUNTER — Encounter: Payer: Self-pay | Admitting: Cardiology

## 2023-08-28 ENCOUNTER — Ambulatory Visit (INDEPENDENT_AMBULATORY_CARE_PROVIDER_SITE_OTHER): Payer: Medicare Other | Admitting: Cardiology

## 2023-08-28 VITALS — BP 138/58 | HR 82 | Ht 67.0 in | Wt 143.0 lb

## 2023-08-28 DIAGNOSIS — R55 Syncope and collapse: Secondary | ICD-10-CM | POA: Diagnosis not present

## 2023-08-28 DIAGNOSIS — E7800 Pure hypercholesterolemia, unspecified: Secondary | ICD-10-CM | POA: Diagnosis not present

## 2023-08-28 NOTE — Patient Instructions (Signed)
    Testing/Procedures:  CORONARY CALCIUM SCORING CT SCAN AT THE IMAGING DEPARTMENT MED[facility]    Follow-Up: At [facility], you and your health needs are our priority.  As part of our continuing mission to provide you with exceptional heart care, we have created designated Provider Care Teams.  These Care Teams include your primary Cardiologist (physician) and Advanced Practice Providers (APPs -  Physician Assistants and Nurse Practitioners) who all work together to provide you with the care you need, when you need it.    Your next appointment:   12 month(s)  Provider:   Olga Millers, MD

## 2023-08-29 DIAGNOSIS — E109 Type 1 diabetes mellitus without complications: Secondary | ICD-10-CM | POA: Diagnosis not present

## 2023-09-05 ENCOUNTER — Ambulatory Visit (INDEPENDENT_AMBULATORY_CARE_PROVIDER_SITE_OTHER): Payer: Self-pay

## 2023-09-05 DIAGNOSIS — R55 Syncope and collapse: Secondary | ICD-10-CM

## 2023-09-09 ENCOUNTER — Encounter: Payer: Self-pay | Admitting: *Deleted

## 2023-09-24 ENCOUNTER — Other Ambulatory Visit: Payer: Self-pay | Admitting: *Deleted

## 2023-09-24 DIAGNOSIS — R918 Other nonspecific abnormal finding of lung field: Secondary | ICD-10-CM

## 2023-10-11 ENCOUNTER — Other Ambulatory Visit: Payer: Self-pay | Admitting: Medical-Surgical

## 2023-10-11 DIAGNOSIS — E785 Hyperlipidemia, unspecified: Secondary | ICD-10-CM

## 2023-10-11 NOTE — Telephone Encounter (Signed)
 Copied from CRM [id number]. Topic: Clinical - Medication Refill >> Oct 11, 2023  2:58 PM Corin V wrote: Most Recent Primary Care Visit:  Provider: Cherre Cornish  Department: [facility]  Visit Type: PHYSICAL  Date: 04/09/2023  Medication: atorvastatin  (LIPITOR) 20 MG tablet  Has the patient contacted their pharmacy? Yes (Agent: If no, request that the patient contact the pharmacy for the refill. If patient does not wish to contact the pharmacy document the reason why and proceed with request.) (Agent: If yes, when and what did the pharmacy advise?)  Is this the correct pharmacy for this prescription? Yes If no, delete pharmacy and type the correct one.  This is the patient's preferred pharmacy:  [facility]- [place], [place]   Has the prescription been filled recently? No  Is the patient out of the medication? No  Has the patient been seen for an appointment in the last year OR does the patient have an upcoming appointment? Yes  Can we respond through MyChart? No  Agent: Please be advised that Rx refills may take up to 3 business days. We ask that you follow-up with your pharmacy.

## 2023-10-22 ENCOUNTER — Other Ambulatory Visit: Payer: Self-pay | Admitting: Medical-Surgical

## 2023-10-22 DIAGNOSIS — E785 Hyperlipidemia, unspecified: Secondary | ICD-10-CM

## 2023-10-22 NOTE — Telephone Encounter (Signed)
 Copied from CRM [id number]. Topic: Clinical - Medication Refill >> Oct 22, 2023  3:43 PM Shelby Dessert H wrote: Most Recent Primary Care Visit:  Provider: Cherre Cornish  Department: [facility]  Visit Type: PHYSICAL  Date: 04/09/2023  Medication: atorvastatin  (LIPITOR) 20 MG tablet  Has the patient contacted their pharmacy? Yes (Agent: If no, request that the patient contact the pharmacy for the refill. If patient does not wish to contact the pharmacy document the reason why and proceed with request.) (Agent: If yes, when and what did the pharmacy advise?)  Is this the correct pharmacy for this prescription? Yes If no, delete pharmacy and type the correct one.  This is the patient's preferred pharmacy:  [facility] - [place], [place] - [address] [place] [address] Phone: [phone] Fax: [phone]   Has the prescription been filled recently? Yes  Is the patient out of the medication? Yes  Has the patient been seen for an appointment in the last year OR does the patient have an upcoming appointment? Yes  Can we respond through MyChart? Yes  Agent: Please be advised that Rx refills may take up to 3 business days. We ask that you follow-up with your pharmacy.

## 2023-10-22 NOTE — Telephone Encounter (Signed)
 Was refused 6 days ago, says it was called in

## 2023-10-30 ENCOUNTER — Other Ambulatory Visit: Payer: Self-pay | Admitting: Medical-Surgical

## 2023-10-30 DIAGNOSIS — E785 Hyperlipidemia, unspecified: Secondary | ICD-10-CM

## 2023-10-30 MED ORDER — ATORVASTATIN CALCIUM 20 MG PO TABS: 20.0000 mg | ORAL_TABLET | Freq: Every day | ORAL | 3 refills | Status: AC

## 2023-10-30 NOTE — Telephone Encounter (Signed)
 Copied from CRM [id number]. Topic: Clinical - Prescription Issue >> Oct 30, 2023 10:16 AM Kevelyn M wrote: Reason for CRM: Patient needs to switch Pharmacys. She needs a refill for the atorvastatin  (LIPITOR) 20 MG tablet   New Pharmacy: [facility] - [phone] delivery pharmacy) [phone] (Pharmacist Assistant)  [facility] [address] [place], [place] [address]

## 2023-12-23 ENCOUNTER — Encounter: Payer: Self-pay | Admitting: Obstetrics & Gynecology

## 2023-12-23 ENCOUNTER — Ambulatory Visit: Admitting: Obstetrics & Gynecology

## 2023-12-23 VITALS — BP 138/69 | HR 65 | Ht 66.5 in | Wt 137.0 lb

## 2023-12-23 DIAGNOSIS — Z1331 Encounter for screening for depression: Secondary | ICD-10-CM

## 2023-12-23 DIAGNOSIS — L900 Lichen sclerosus et atrophicus: Secondary | ICD-10-CM | POA: Diagnosis not present

## 2023-12-23 DIAGNOSIS — Z01419 Encounter for gynecological examination (general) (routine) without abnormal findings: Secondary | ICD-10-CM

## 2023-12-23 MED ORDER — TACROLIMUS 0.03 % EX OINT: TOPICAL_OINTMENT | Freq: Two times a day (BID) | CUTANEOUS | 1 refills | Status: AC

## 2023-12-23 NOTE — Progress Notes (Signed)
   Subjective:     Tanya Schmidt is a 68 y.o. female here for a routine exam.  Current complaints:  occasional burning of perineum.  She has not been using the tacrolimus  for LS.  She is using lubricants. She is not sexually active at this time.    Gynecologic History No LMP recorded. Patient is postmenopausal. Contraception: post menopausal status Last pap smear (date and result):2021 Last mammogram (date and result):2025 Last colon screening (date and result):2018 Brush:Daily Floss:Daily Seatbelts: Yes Sunscreen: Yes  Dexa: 2025   Obstetric History OB History  Gravida Para Term Preterm AB Living  3 2 2   2   SAB IAB Ectopic Multiple Live Births          # Outcome Date GA Lbr Len/2nd Weight Sex Type Anes PTL Lv  3 Gravida           2 Term    11 lb 3 oz (5.075 kg)  CS-Unspec     1 Term    8 lb 3 oz (3.714 kg)  CS-Unspec        The following portions of the patient's history were reviewed and updated as appropriate: allergies, current medications, past family history, past medical history, past social history, past surgical history, and problem list.  Review of Systems Pertinent items noted in HPI and remainder of comprehensive ROS otherwise negative.    Objective:     Vitals:   12/23/23 1057  BP: 138/69  Pulse: 65  Weight: 137 lb (62.1 kg)  Height: 5' 6.5 (1.689 m)   Vitals:  WNL General appearance: alert, cooperative and no distress  HEENT: Normocephalic, without obvious abnormality, atraumatic Eyes: negative Throat: lips, mucosa, and tongue normal; teeth and gums normal  Respiratory: Clear to auscultation bilaterally  CV: Regular rate and rhythm  Breasts:  Normal appearance, no masses or tenderness, no nipple retraction or dimpling  GI: Soft, non-tender; bowel sounds normal; no masses,  no organomegaly  GU: External Genitalia:  Tanner V, Slight worsedning of LS (not using Tacrolimus ) Urethra:  No prolapse   Vagina: Pink, normal rugae, no blood or  discharge  Cervix: No CMT, no lesion  Uterus:  Normal size and contour, non tender  Adnexa: Normal, no masses, non tender  Musculoskeletal: No edema, redness or tenderness in the calves or thighs  Skin: No lesions or rash  Lymphatic: Axillary adenopathy: none     Psychiatric: Normal mood and behavior        Assessment:    Healthy female exam.  LS   Plan:    Pap smear not indicated--last pap 2021 and normal.  Regular screening.  No history of cervical dysplasia.  Yearly mammograms LS--restart tacro bid and reassess with photos in 1 month.

## 2024-01-07 ENCOUNTER — Encounter: Payer: Self-pay | Admitting: Medical-Surgical

## 2024-01-07 MED ORDER — ERYTHROMYCIN 5 MG/GM OP OINT: 1.0000 | TOPICAL_OINTMENT | Freq: Three times a day (TID) | OPHTHALMIC | 0 refills | Status: AC

## 2024-01-09 ENCOUNTER — Ambulatory Visit (INDEPENDENT_AMBULATORY_CARE_PROVIDER_SITE_OTHER): Admitting: Medical-Surgical

## 2024-01-09 ENCOUNTER — Encounter: Payer: Self-pay | Admitting: Medical-Surgical

## 2024-01-09 VITALS — BP 109/60 | HR 64 | Resp 20 | Ht 66.5 in | Wt 136.0 lb

## 2024-01-09 DIAGNOSIS — F419 Anxiety disorder, unspecified: Secondary | ICD-10-CM | POA: Diagnosis not present

## 2024-01-09 MED ORDER — BUSPIRONE HCL 5 MG PO TABS: 5.0000 mg | ORAL_TABLET | Freq: Two times a day (BID) | ORAL | 0 refills | Status: DC

## 2024-01-09 NOTE — Progress Notes (Signed)
        Established patient visit  Discussed the use of AI scribe software for clinical note transcription with the patient, who gave verbal consent to proceed.  History of Present Illness   Tanya Schmidt is a 68 year old female who presents with recurrent styes and anxiety.  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  Constitutional:      General: She is not in acute distress.    Appearance: Normal appearance.  HENT:     Head: Normocephalic and atraumatic.  Eyes:      Comments: Redness and edema at the sight of the current stye  Cardiovascular:     Rate and Rhythm: Normal rate and regular rhythm.     Pulses: Normal pulses.     Heart sounds: Normal heart sounds. No murmur heard.    No friction rub. No gallop.  Pulmonary:     Effort: Pulmonary effort is normal. No respiratory distress.     Breath sounds: Normal breath sounds. No wheezing.  Skin:    General: Skin is warm and dry.  Neurological:     Mental Status: She is alert and oriented to person, place, and time.  Psychiatric:        Mood and Affect: Mood normal.        Behavior: Behavior normal.        Thought Content: Thought content normal.        Judgment: Judgment normal.     Assessment and Plan    Recurrent Stye Recurrent styes possibly linked to incomplete eye makeup removal. Discussed chronic blepharitis and emphasized hygiene. Warned of cellulitis risk if redness and swelling spread. - Continue erythromycin   ointment as prescribed. - Use warm compresses 4-5 times daily. - Ensure complete removal of eye makeup daily. - Consider ophthalmology referral if styes become more frequent or severe. - Monitor for cellulitis signs, such as spreading redness and swelling. - Wash makeup brushes regularly to prevent infection.  Anxiety Lifelong anxiety worsened by situational stressors. Discussed benzodiazepine risks and recommended Buspar  as a non-addictive option. Explained medication and counseling together provide fastest results. - Prescribe Buspar , starting with 5-15 mg twice daily, and adjust as needed. - Consider counseling if medication alone is insufficient. - Monitor for effectiveness and side effects of Buspar .     Return for anxiety follow up in 4 weeks via MyChart message.  __________________________________ Zada FREDRIK Palin, DNP, APRN, FNP-BC Primary Care and Sports Medicine [facility] [place]

## 2024-01-27 ENCOUNTER — Encounter: Payer: Self-pay | Admitting: Obstetrics & Gynecology

## 2024-01-27 ENCOUNTER — Ambulatory Visit (INDEPENDENT_AMBULATORY_CARE_PROVIDER_SITE_OTHER): Admitting: Obstetrics & Gynecology

## 2024-01-27 ENCOUNTER — Other Ambulatory Visit (HOSPITAL_COMMUNITY)
Admission: RE | Admit: 2024-01-27 | Discharge: 2024-01-27 | Disposition: A | Discharge status: Home / Self Care | Source: Ambulatory Visit | Attending: Obstetrics & Gynecology | Admitting: Obstetrics & Gynecology

## 2024-01-27 VITALS — BP 119/66 | HR 64 | Ht 66.5 in | Wt 136.0 lb

## 2024-01-27 DIAGNOSIS — L900 Lichen sclerosus et atrophicus: Secondary | ICD-10-CM

## 2024-01-27 NOTE — Progress Notes (Signed)
   Subjective:    Patient ID: Tanya Schmidt, female    DOB: Jan 31, 1956, 68 y.o.   MRN: [id number]  HPI  Tanya Schmidt is a 68 year old female who is being followed for lichen sclerosis.  She is here for short-term follow-up after restarting her tacrolimus .  She has placed it religiously.  She feels like the plaque is gotten worse.  She is unable to take topical steroids.  It made her diabetes uncontrolled with hyperglycemia.  Review of Systems  Constitutional: Negative.   Respiratory: Negative.    Cardiovascular: Negative.   Gastrointestinal: Negative.   Genitourinary: Negative.  Negative for vaginal bleeding.       Vulvar and perineal pain and itching       Objective:   Physical Exam Vitals reviewed.  Constitutional:      General: She is not in acute distress.    Appearance: She is well-developed.  HENT:     Head: Normocephalic and atraumatic.  Eyes:     Conjunctiva/sclera: Conjunctivae normal.  Cardiovascular:     Rate and Rhythm: Normal rate.  Pulmonary:     Effort: Pulmonary effort is normal.  Genitourinary:     Comments: The perineal plaque increased in size from last exam. Skin:    General: Skin is warm and dry.  Neurological:     Mental Status: She is alert and oriented to person, place, and time.  Psychiatric:        Mood and Affect: Mood normal.           Assessment & Plan:  68 year old female with lichen sclerosus  Patient is still symptomatic and plaque seems a little thicker.  Will biopsy today.  VULVAR BIOPSY NOTE  The indications for vulvar biopsy (rule out neoplasia, establish lichen sclerosus diagnosis) were reviewed.   Risks of the biopsy including pain, bleeding, infection, inadequate specimen, scarring and need for additional procedures  were discussed. The patient stated understanding and agreed to undergo procedure today. Consent was signed,  time out performed.  The patient's vulva was prepped with Betadine. 1% lidocaine  was injected into the  perineum. A 4-mm punch biopsy was done, biopsy tissue was picked up with sterile forceps and sterile scissors were used to excise the lesion.  Small bleeding was noted and hemostasis was achieved using silver nitrate sticks.  The patient tolerated the procedure well. Post-procedure instructions  (pelvic rest for one week) were given to the patient. The patient is to call with heavy bleeding, fever greater than 100.4, foul smelling vaginal discharge or other concerns. Will base treatment on results.

## 2024-02-04 LAB — SURGICAL PATHOLOGY

## 2024-02-05 ENCOUNTER — Ambulatory Visit: Payer: Self-pay | Admitting: Obstetrics and Gynecology

## 2024-02-06 ENCOUNTER — Encounter: Payer: Self-pay | Admitting: Medical-Surgical

## 2024-02-10 ENCOUNTER — Encounter: Payer: Self-pay | Admitting: Obstetrics & Gynecology

## 2024-03-16 ENCOUNTER — Other Ambulatory Visit: Payer: Self-pay

## 2024-03-16 MED ORDER — BUSPIRONE HCL 5 MG PO TABS: 5.0000 mg | ORAL_TABLET | Freq: Two times a day (BID) | ORAL | 1 refills | Status: AC

## 2024-03-16 NOTE — Progress Notes (Signed)
 Done

## 2024-03-16 NOTE — Progress Notes (Signed)
 Requesting 90 days sent to [facility]

## 2024-03-23 ENCOUNTER — Encounter: Payer: Self-pay | Admitting: Obstetrics & Gynecology

## 2024-03-23 ENCOUNTER — Ambulatory Visit (INDEPENDENT_AMBULATORY_CARE_PROVIDER_SITE_OTHER): Admitting: Obstetrics & Gynecology

## 2024-03-23 VITALS — BP 124/66 | HR 70 | Ht 66.5 in | Wt 137.0 lb

## 2024-03-23 DIAGNOSIS — L900 Lichen sclerosus et atrophicus: Secondary | ICD-10-CM | POA: Diagnosis not present

## 2024-03-23 NOTE — Progress Notes (Signed)
   Subjective:    Patient ID: Tanya Schmidt, female    DOB: 25-Dec-1955, 68 y.o.   MRN: [id number]  HPI 68 yo female with LS Tacro 2x a day--less symptomatic Calcium  glyconate heps symptoms.    Review of Systems     Objective:   Physical Exam        Assessment & Plan:  68 yo female with moderate improvement of LS with bid tacrolimus .     Continue current treatment--bid tacro for 6 more weeks  RTC 6 weeks

## 2024-04-01 ENCOUNTER — Encounter: Payer: Self-pay | Admitting: Obstetrics & Gynecology

## 2024-04-08 NOTE — Progress Notes (Deleted)
        Established patient visit   History of Present Illness   Discussed the use of AI scribe software for clinical note transcription with the patient, who gave verbal consent to proceed.  History of Present Illness           Physical Exam   Physical Exam Assessment & Plan   Problem List Items Addressed This Visit       Endocrine   Diabetes mellitus type I (HCC)   Other Visit Diagnoses       Annual physical exam    -  Primary     Hyperlipidemia LDL goal <70         Anxiety          Assessment and Plan             Follow up   No follow-ups on file. __________________________________ Zada FREDRIK Palin, DNP, APRN, FNP-BC Primary Care and Sports Medicine [facility]

## 2024-04-08 NOTE — Patient Instructions (Incomplete)
 Preventive Care 83 Years and Older, Female Preventive care refers to lifestyle choices and visits with your health care provider that can promote health and wellness. Preventive care visits are also called wellness exams. What can I expect for my preventive care visit? Counseling Your health care provider may ask you questions about your: Medical history, including: Past medical problems. Family medical history. Pregnancy and menstrual history. History of falls. Current health, including: Memory and ability to understand (cognition). Emotional well-being. Home life and relationship well-being. Sexual activity and sexual health. Lifestyle, including: Alcohol, nicotine or tobacco, and drug use. Access to firearms. Diet, exercise, and sleep habits. Work and work Astronomer. Sunscreen use. Safety issues such as seatbelt and bike helmet use. Physical exam Your health care provider will check your: Height and weight. These may be used to calculate your BMI (body mass index). BMI is a measurement that tells if you are at a healthy weight. Waist circumference. This measures the distance around your waistline. This measurement also tells if you are at a healthy weight and may help predict your risk of certain diseases, such as type 2 diabetes and high blood pressure. Heart rate and blood pressure. Body temperature. Skin for abnormal spots. What immunizations do I need?  Vaccines are usually given at various ages, according to a schedule. Your health care provider will recommend vaccines for you based on your age, medical history, and lifestyle or other factors, such as travel or where you work. What tests do I need? Screening Your health care provider may recommend screening tests for certain conditions. This may include: Lipid and cholesterol levels. Hepatitis C test. Hepatitis B test. HIV (human immunodeficiency virus) test. STI (sexually transmitted infection) testing, if you are at  risk. Lung cancer screening. Colorectal cancer screening. Diabetes screening. This is done by checking your blood sugar (glucose) after you have not eaten for a while (fasting). Mammogram. Talk with your health care provider about how often you should have regular mammograms. BRCA-related cancer screening. This may be done if you have a family history of breast, ovarian, tubal, or peritoneal cancers. Bone density scan. This is done to screen for osteoporosis. Talk with your health care provider about your test results, treatment options, and if necessary, the need for more tests. Follow these instructions at home: Eating and drinking  Eat a diet that includes fresh fruits and vegetables, whole grains, lean protein, and low-fat dairy products. Limit your intake of foods with high amounts of sugar, saturated fats, and salt. Take vitamin and mineral supplements as recommended by your health care provider. Do not drink alcohol if your health care provider tells you not to drink. If you drink alcohol: Limit how much you have to 0-1 drink a day. Know how much alcohol is in your drink. In the [place] one drink equals one 12 oz bottle of beer (355 mL), one 5 oz glass of wine (148 mL), or one 1 oz glass of hard liquor (44 mL). Lifestyle Brush your teeth every morning and night with fluoride toothpaste. Floss one time each day. Exercise for at least 30 minutes 5 or more days each week. Do not use any products that contain nicotine or tobacco. These products include cigarettes, chewing tobacco, and vaping devices, such as e-cigarettes. If you need help quitting, ask your health care provider. Do not use drugs. If you are sexually active, practice safe sex. Use a condom or other form of protection in order to prevent STIs. Take aspirin only as told by  your health care provider. Make sure that you understand how much to take and what form to take. Work with your health care provider to find out whether it  is safe and beneficial for you to take aspirin daily. Ask your health care provider if you need to take a cholesterol-lowering medicine (statin). Find healthy ways to manage stress, such as: Meditation, yoga, or listening to music. Journaling. Talking to a trusted person. Spending time with friends and family. Minimize exposure to UV radiation to reduce your risk of skin cancer. Safety Always wear your seat belt while driving or riding in a vehicle. Do not drive: If you have been drinking alcohol. Do not ride with someone who has been drinking. When you are tired or distracted. While texting. If you have been using any mind-altering substances or drugs. Wear a helmet and other protective equipment during sports activities. If you have firearms in your house, make sure you follow all gun safety procedures. What's next? Visit your health care provider once a year for an annual wellness visit. Ask your health care provider how often you should have your eyes and teeth checked. Stay up to date on all vaccines. This information is not intended to replace advice given to you by your health care provider. Make sure you discuss any questions you have with your health care provider. Document Revised: 12/07/2020 Document Reviewed: 12/07/2020 Elsevier Patient Education  2024 [employer].

## 2024-04-09 ENCOUNTER — Encounter: Payer: BC Managed Care – PPO | Admitting: Medical-Surgical

## 2024-04-09 DIAGNOSIS — F419 Anxiety disorder, unspecified: Secondary | ICD-10-CM

## 2024-04-09 DIAGNOSIS — E1069 Type 1 diabetes mellitus with other specified complication: Secondary | ICD-10-CM

## 2024-04-09 DIAGNOSIS — E785 Hyperlipidemia, unspecified: Secondary | ICD-10-CM

## 2024-04-09 DIAGNOSIS — Z0000 Encounter for general adult medical examination without abnormal findings: Secondary | ICD-10-CM

## 2024-04-23 ENCOUNTER — Encounter: Payer: Self-pay | Admitting: Medical-Surgical

## 2024-04-23 ENCOUNTER — Ambulatory Visit: Admitting: Medical-Surgical

## 2024-04-23 VITALS — BP 112/55 | HR 77 | Resp 20 | Ht 66.5 in | Wt 141.1 lb

## 2024-04-23 DIAGNOSIS — Z0000 Encounter for general adult medical examination without abnormal findings: Secondary | ICD-10-CM | POA: Diagnosis not present

## 2024-04-23 DIAGNOSIS — E1069 Type 1 diabetes mellitus with other specified complication: Secondary | ICD-10-CM

## 2024-04-23 DIAGNOSIS — Z23 Encounter for immunization: Secondary | ICD-10-CM | POA: Diagnosis not present

## 2024-04-23 LAB — HEMOGLOBIN A1C: Hemoglobin A1C: 6.3

## 2024-04-23 NOTE — Progress Notes (Signed)
 Complete physical exam  Patient: Tanya Schmidt   DOB: [birth year]   68 y.o. Female  MRN: [id number]  Subjective:    Chief Complaint  Patient presents with   Annual Exam    Tanya Schmidt is a 68 y.o. female who presents today for a complete physical exam. She reports consuming a low oxalate diet. Walking 3-4 times weekly, working at hot yoga two days a week. She generally feels well. She reports sleeping fairly well. She does not have additional problems to discuss today.    Most recent fall risk assessment:    04/23/2024    1:56 PM  Fall Risk   Falls in the past year? 1  Number falls in past yr: 1  Injury with Fall? 1  Risk for fall due to : History of fall(s)  Follow up Falls evaluation completed     Most recent depression screenings:    04/23/2024    1:56 PM 01/09/2024   11:08 AM  PHQ 2/9 Scores  PHQ - 2 Score 2 4  PHQ- 9 Score 5 10    Vision:Within last year and Dental: No current dental problems and No regular dental care     Patient Care Team: Willo Mini, NP as PCP - General (Nurse Practitioner)   Outpatient Medications Prior to Visit  Medication Sig   atorvastatin  (LIPITOR) 20 MG tablet Take 1 tablet (20 mg total) by mouth daily.   busPIRone  (BUSPAR ) 5 MG tablet Take 1-3 tablets (5-15 mg total) by mouth 2 (two) times daily.   CALCIUM  PO Take by mouth daily. Citrate   Continuous Glucose Sensor (DEXCOM G7 SENSOR) MISC Replace every 10 days.   glucagon 1 MG injection Used as directed for severe hypoglycemia   Insulin  Infusion Pump (DEXCOM G6 CONTROL-IQ INS PUMP) DEVI by Does not apply route.   Lancets (ONETOUCH ULTRASOFT) lancets Testing 4 times daily   levothyroxine  (SYNTHROID ) 75 MCG tablet Take 1 tablet (75 mcg total) by mouth daily.   NOVOLOG 100 UNIT/ML injection Inject into the skin.   OVER THE COUNTER MEDICATION Take by mouth at bedtime as needed (Natrol for sleep).   tacrolimus  (PROTOPIC ) 0.03 % ointment Apply topically 2 (two) times daily.    No facility-administered medications prior to visit.    Review of Systems  Constitutional:  Negative for chills, fever, malaise/fatigue and weight loss.  HENT:  Negative for congestion, ear pain, hearing loss, sinus pain and sore throat.   Eyes:  Negative for blurred vision, photophobia and pain.  Respiratory:  Negative for cough, shortness of breath and wheezing.   Cardiovascular:  Negative for chest pain, palpitations and leg swelling.  Gastrointestinal:  Negative for abdominal pain, constipation, diarrhea, heartburn, nausea and vomiting.  Genitourinary:  Negative for dysuria, frequency and urgency.  Musculoskeletal:  Positive for back pain (low back tightness). Negative for falls and neck pain.  Skin:  Negative for itching and rash.  Neurological:  Positive for tingling (top of 2nd and 3rd toes on the right foot). Negative for dizziness, weakness and headaches.  Endo/Heme/Allergies:  Negative for polydipsia. Does not bruise/bleed easily.  Psychiatric/Behavioral:  Negative for depression, substance abuse and suicidal ideas. The patient has insomnia (difficulty maintaining sleep). The patient is not nervous/anxious.      Objective:     BP (!) 112/55 (BP Location: Right Arm, Cuff Size: Normal)   Pulse 77   Resp 20   Ht 5' 6.5 (1.689 m)   Wt 141 lb 1.9 oz (64 kg)  SpO2 99%   BMI 22.44 kg/m    Physical Exam Vitals reviewed.  Constitutional:      General: She is not in acute distress.    Appearance: Normal appearance. She is not ill-appearing.  HENT:     Head: Normocephalic and atraumatic.     Right Ear: Tympanic membrane, ear canal and external ear normal. There is no impacted cerumen.     Left Ear: Tympanic membrane, ear canal and external ear normal. There is no impacted cerumen.     Nose: Nose normal. No congestion or rhinorrhea.     Mouth/Throat:     Mouth: Mucous membranes are moist.     Pharynx: No oropharyngeal exudate or posterior oropharyngeal erythema.  Eyes:      General: No scleral icterus.       Right eye: No discharge.        Left eye: No discharge.     Extraocular Movements: Extraocular movements intact.     Conjunctiva/sclera: Conjunctivae normal.     Pupils: Pupils are equal, round, and reactive to light.  Neck:     Thyroid : No thyromegaly.     Vascular: No carotid bruit or JVD.     Trachea: Trachea normal.  Cardiovascular:     Rate and Rhythm: Normal rate and regular rhythm.     Pulses: Normal pulses.     Heart sounds: Normal heart sounds. No murmur heard.    No friction rub. No gallop.  Pulmonary:     Effort: Pulmonary effort is normal. No respiratory distress.     Breath sounds: Normal breath sounds. No wheezing.  Abdominal:     General: Bowel sounds are normal. There is no distension.     Palpations: Abdomen is soft.     Tenderness: There is no abdominal tenderness. There is no guarding.  Musculoskeletal:        General: Normal range of motion.     Cervical back: Normal range of motion and neck supple.  Lymphadenopathy:     Cervical: No cervical adenopathy.  Skin:    General: Skin is warm and dry.  Neurological:     Mental Status: She is alert and oriented to person, place, and time.     Cranial Nerves: No cranial nerve deficit.  Psychiatric:        Mood and Affect: Mood normal.        Behavior: Behavior normal.        Thought Content: Thought content normal.        Judgment: Judgment normal.      Results for orders placed or performed in visit on 04/23/24  Hemoglobin A1c  Result Value Ref Range   Hemoglobin A1C 6.3        Assessment & Plan:    Routine Health Maintenance and Physical Exam  Immunization History  Administered Date(s) Administered   H1N1 07/13/2008   INFLUENZA, HIGH DOSE SEASONAL PF 04/23/2024   Influenza Split 05/25/2009, 03/24/2019   Influenza,inj,Quad PF,6-35 Mos 04/05/2017, 03/20/2018   Influenza,inj,quad, With Preservative 04/06/2013, 03/24/2014, 04/13/2015, 03/27/2016, 03/24/2019    Influenza-Unspecified 03/09/2020, 03/08/2021, 04/18/2022   Moderna SARS-COV2 Booster Vaccination 05/09/2020   Moderna Sars-Covid-2 Vaccination 09/15/2019, 10/13/2019   Pfizer(Comirnaty)Fall Seasonal Vaccine 12 years and older 04/09/2023   Pneumococcal Conjugate-13 04/29/2019   Pneumococcal Polysaccharide-23 04/26/2015   Tdap 08/28/2019   Zoster Recombinant(Shingrix ) 04/26/2022, 06/29/2022   Health Maintenance  Topic Date Due   Medicare Annual Wellness (AWV)  Never done   Hepatitis C Screening  Never done  Pneumococcal Vaccine: 50+ Years (3 of 3 - PCV20 or PCV21) 04/25/2020   FOOT EXAM  03/09/2022   OPHTHALMOLOGY EXAM  08/28/2023   Diabetic kidney evaluation - Urine ACR  01/24/2024   Diabetic kidney evaluation - eGFR measurement  03/21/2024   COVID-19 Vaccine (5 - 2025-26 season) 05/09/2024 (Originally 02/24/2024)   HEMOGLOBIN A1C  10/22/2024   Mammogram  07/16/2025   Colonoscopy  09/21/2026   DTaP/Tdap/Td (2 - Td or Tdap) 08/27/2029   Influenza Vaccine  Completed   DEXA SCAN  Completed   Zoster Vaccines- Shingrix   Completed   Meningococcal B Vaccine  Aged Out    Discussed health benefits of physical activity, and encouraged her to engage in regular exercise appropriate for her age and condition.  1. Annual physical exam (Primary) Labs checked at endocrinology today.  Up-to-date on vision care.  Recommend updating dental care when possible.  Wellness information provided with AVS.  2. Need for influenza vaccination Flu vaccine given in office today. - Flu vaccine HIGH DOSE PF(Fluzone Trivalent)  3. Type 1 diabetes mellitus with other specified complication (HCC) As noted above, followed up with endocrinology today.  Type 1 diabetes is well-controlled and she is up-to-date on preventive care.  Return in about 1 year (around 04/23/2025) for annual physical exam or sooner if needed.   Leatta Alewine, NP

## 2024-05-18 ENCOUNTER — Ambulatory Visit: Admitting: Obstetrics & Gynecology

## 2024-05-25 ENCOUNTER — Ambulatory Visit: Admitting: Obstetrics & Gynecology

## 2024-05-25 ENCOUNTER — Encounter: Payer: Self-pay | Admitting: Obstetrics & Gynecology

## 2024-05-25 VITALS — BP 111/71 | HR 67 | Ht 67.0 in | Wt 139.0 lb

## 2024-05-25 DIAGNOSIS — L900 Lichen sclerosus et atrophicus: Secondary | ICD-10-CM | POA: Diagnosis not present

## 2024-05-25 NOTE — Progress Notes (Unsigned)
   Subjective:    Patient ID: Tanya Schmidt, female    DOB: [birth year], 68 y.o.   MRN: [id number]  HPI  68 yo female presents for f/u LS.  She feels better--no burning or itching.  Using tacrolimus  ointment 1-2 times a day.    Review of Systems  Constitutional: Negative.   Respiratory: Negative.    Cardiovascular: Negative.   Gastrointestinal: Negative.   Genitourinary: Negative.        Objective:   Physical Exam      The thickeness of the perineal skin is stable.  Not symptomatic.       Assessment & Plan:  68 yo female with LS  Ween to tacrolimus  daily-->as allowed decrease daily use from 7/days a week to 3 days a week. RTC in 4 months.  Call with any questions or make appt if becomes symptomatic.

## 2024-05-29 ENCOUNTER — Encounter: Payer: Self-pay | Admitting: Medical-Surgical

## 2024-05-29 DIAGNOSIS — Z1211 Encounter for screening for malignant neoplasm of colon: Secondary | ICD-10-CM

## 2024-06-03 ENCOUNTER — Encounter: Payer: Self-pay | Admitting: Medical-Surgical

## 2025-04-26 ENCOUNTER — Encounter: Admitting: Medical-Surgical
